# Patient Record
Sex: Female | Born: 1970 | Race: Black or African American | Hispanic: No | Smoking: Current every day smoker
Health system: Southern US, Community
[De-identification: ages and names within clinical notes are randomized; demographics above are authoritative.]

## PROBLEM LIST (undated history)

## (undated) DIAGNOSIS — I10 Essential (primary) hypertension: Secondary | ICD-10-CM

## (undated) DIAGNOSIS — F329 Major depressive disorder, single episode, unspecified: Secondary | ICD-10-CM

## (undated) DIAGNOSIS — A599 Trichomoniasis, unspecified: Secondary | ICD-10-CM

## (undated) DIAGNOSIS — R7303 Prediabetes: Secondary | ICD-10-CM

## (undated) DIAGNOSIS — J45909 Unspecified asthma, uncomplicated: Secondary | ICD-10-CM

## (undated) DIAGNOSIS — F32A Depression, unspecified: Secondary | ICD-10-CM

## (undated) DIAGNOSIS — L732 Hidradenitis suppurativa: Secondary | ICD-10-CM

## (undated) DIAGNOSIS — F419 Anxiety disorder, unspecified: Secondary | ICD-10-CM

## (undated) DIAGNOSIS — D649 Anemia, unspecified: Secondary | ICD-10-CM

## (undated) DIAGNOSIS — E7800 Pure hypercholesterolemia, unspecified: Secondary | ICD-10-CM

## (undated) DIAGNOSIS — F319 Bipolar disorder, unspecified: Secondary | ICD-10-CM

## (undated) HISTORY — DX: Prediabetes: R73.03

## (undated) HISTORY — PX: ABDOMINAL HYSTERECTOMY: SHX81

## (undated) HISTORY — PX: MOUTH SURGERY: SHX715

## (undated) HISTORY — DX: Pure hypercholesterolemia, unspecified: E78.00

## (undated) HISTORY — DX: Hidradenitis suppurativa: L73.2

---

## 1997-03-05 ENCOUNTER — Inpatient Hospital Stay (HOSPITAL_COMMUNITY): Admission: AD | Admit: 1997-03-05 | Discharge: 1997-03-08 | Payer: Self-pay | Admitting: Obstetrics

## 1997-03-17 ENCOUNTER — Inpatient Hospital Stay (HOSPITAL_COMMUNITY): Admission: AD | Admit: 1997-03-17 | Discharge: 1997-03-18 | Payer: Self-pay | Admitting: Obstetrics

## 1997-05-30 ENCOUNTER — Other Ambulatory Visit: Admission: RE | Admit: 1997-05-30 | Discharge: 1997-05-30 | Payer: Self-pay | Admitting: Obstetrics

## 1998-02-07 ENCOUNTER — Inpatient Hospital Stay (HOSPITAL_COMMUNITY): Admission: AD | Admit: 1998-02-07 | Discharge: 1998-02-07 | Payer: Self-pay | Admitting: Obstetrics

## 1998-02-08 ENCOUNTER — Emergency Department (HOSPITAL_COMMUNITY): Admission: EM | Admit: 1998-02-08 | Discharge: 1998-02-08 | Payer: Self-pay | Admitting: Emergency Medicine

## 1998-07-09 ENCOUNTER — Emergency Department (HOSPITAL_COMMUNITY): Admission: EM | Admit: 1998-07-09 | Discharge: 1998-07-10 | Payer: Self-pay | Admitting: Emergency Medicine

## 1999-01-12 ENCOUNTER — Emergency Department (HOSPITAL_COMMUNITY): Admission: EM | Admit: 1999-01-12 | Discharge: 1999-01-12 | Payer: Self-pay | Admitting: Emergency Medicine

## 1999-02-10 ENCOUNTER — Inpatient Hospital Stay (HOSPITAL_COMMUNITY): Admission: AD | Admit: 1999-02-10 | Discharge: 1999-02-10 | Payer: Self-pay | Admitting: Obstetrics

## 2000-08-16 ENCOUNTER — Emergency Department (HOSPITAL_COMMUNITY): Admission: EM | Admit: 2000-08-16 | Discharge: 2000-08-16 | Payer: Self-pay

## 2000-08-18 ENCOUNTER — Emergency Department (HOSPITAL_COMMUNITY): Admission: EM | Admit: 2000-08-18 | Discharge: 2000-08-18 | Payer: Self-pay | Admitting: *Deleted

## 2001-07-06 ENCOUNTER — Inpatient Hospital Stay (HOSPITAL_COMMUNITY): Admission: AD | Admit: 2001-07-06 | Discharge: 2001-07-06 | Payer: Self-pay | Admitting: Obstetrics & Gynecology

## 2001-07-28 ENCOUNTER — Encounter (INDEPENDENT_AMBULATORY_CARE_PROVIDER_SITE_OTHER): Payer: Self-pay

## 2001-07-28 ENCOUNTER — Observation Stay (HOSPITAL_COMMUNITY): Admission: RE | Admit: 2001-07-28 | Discharge: 2001-07-29 | Payer: Self-pay | Admitting: Obstetrics & Gynecology

## 2002-04-26 ENCOUNTER — Other Ambulatory Visit: Admission: RE | Admit: 2002-04-26 | Discharge: 2002-04-26 | Payer: Self-pay | Admitting: *Deleted

## 2003-01-05 ENCOUNTER — Emergency Department (HOSPITAL_COMMUNITY): Admission: EM | Admit: 2003-01-05 | Discharge: 2003-01-05 | Payer: Self-pay | Admitting: Emergency Medicine

## 2003-01-25 ENCOUNTER — Emergency Department (HOSPITAL_COMMUNITY): Admission: EM | Admit: 2003-01-25 | Discharge: 2003-01-25 | Payer: Self-pay | Admitting: Emergency Medicine

## 2003-02-15 ENCOUNTER — Emergency Department (HOSPITAL_COMMUNITY): Admission: EM | Admit: 2003-02-15 | Discharge: 2003-02-15 | Payer: Self-pay | Admitting: Emergency Medicine

## 2003-03-28 ENCOUNTER — Other Ambulatory Visit: Admission: RE | Admit: 2003-03-28 | Discharge: 2003-03-28 | Payer: Self-pay | Admitting: Obstetrics and Gynecology

## 2004-07-04 ENCOUNTER — Other Ambulatory Visit: Admission: RE | Admit: 2004-07-04 | Discharge: 2004-07-04 | Payer: Self-pay | Admitting: Obstetrics and Gynecology

## 2005-02-10 ENCOUNTER — Emergency Department (HOSPITAL_COMMUNITY): Admission: EM | Admit: 2005-02-10 | Discharge: 2005-02-10 | Payer: Self-pay | Admitting: Emergency Medicine

## 2005-07-06 ENCOUNTER — Other Ambulatory Visit: Admission: RE | Admit: 2005-07-06 | Discharge: 2005-07-06 | Payer: Self-pay | Admitting: Obstetrics and Gynecology

## 2005-10-28 ENCOUNTER — Emergency Department (HOSPITAL_COMMUNITY): Admission: EM | Admit: 2005-10-28 | Discharge: 2005-10-28 | Payer: Self-pay | Admitting: Family Medicine

## 2006-01-18 ENCOUNTER — Emergency Department (HOSPITAL_COMMUNITY): Admission: EM | Admit: 2006-01-18 | Discharge: 2006-01-18 | Payer: Self-pay | Admitting: Emergency Medicine

## 2007-01-04 ENCOUNTER — Emergency Department (HOSPITAL_COMMUNITY): Admission: EM | Admit: 2007-01-04 | Discharge: 2007-01-05 | Payer: Self-pay | Admitting: Emergency Medicine

## 2007-01-07 ENCOUNTER — Emergency Department (HOSPITAL_COMMUNITY): Admission: EM | Admit: 2007-01-07 | Discharge: 2007-01-07 | Payer: Self-pay | Admitting: Sports Medicine

## 2008-04-10 ENCOUNTER — Emergency Department (HOSPITAL_COMMUNITY): Admission: EM | Admit: 2008-04-10 | Discharge: 2008-04-10 | Payer: Self-pay | Admitting: Emergency Medicine

## 2008-10-22 DIAGNOSIS — A6000 Herpesviral infection of urogenital system, unspecified: Secondary | ICD-10-CM | POA: Insufficient documentation

## 2008-10-22 DIAGNOSIS — D649 Anemia, unspecified: Secondary | ICD-10-CM

## 2008-10-22 DIAGNOSIS — J45909 Unspecified asthma, uncomplicated: Secondary | ICD-10-CM | POA: Insufficient documentation

## 2008-10-23 ENCOUNTER — Ambulatory Visit: Payer: Self-pay | Admitting: Internal Medicine

## 2008-10-23 DIAGNOSIS — R51 Headache: Secondary | ICD-10-CM

## 2008-10-23 DIAGNOSIS — E789 Disorder of lipoprotein metabolism, unspecified: Secondary | ICD-10-CM | POA: Insufficient documentation

## 2008-10-23 DIAGNOSIS — R519 Headache, unspecified: Secondary | ICD-10-CM | POA: Insufficient documentation

## 2008-10-23 DIAGNOSIS — G43909 Migraine, unspecified, not intractable, without status migrainosus: Secondary | ICD-10-CM | POA: Insufficient documentation

## 2008-10-23 DIAGNOSIS — F17200 Nicotine dependence, unspecified, uncomplicated: Secondary | ICD-10-CM

## 2008-10-23 DIAGNOSIS — I10 Essential (primary) hypertension: Secondary | ICD-10-CM | POA: Insufficient documentation

## 2008-10-23 LAB — CONVERTED CEMR LAB
ALT: 14 units/L (ref 0–35)
AST: 19 units/L (ref 0–37)
Albumin: 3.8 g/dL (ref 3.5–5.2)
Alkaline Phosphatase: 50 units/L (ref 39–117)
BUN: 6 mg/dL (ref 6–23)
Basophils Absolute: 0.1 10*3/uL (ref 0.0–0.1)
Basophils Relative: 1.4 % (ref 0.0–3.0)
Bilirubin, Direct: 0.2 mg/dL (ref 0.0–0.3)
CO2: 29 meq/L (ref 19–32)
Calcium: 8.9 mg/dL (ref 8.4–10.5)
Chloride: 105 meq/L (ref 96–112)
Cholesterol: 187 mg/dL (ref 0–200)
Creatinine, Ser: 0.9 mg/dL (ref 0.4–1.2)
Eosinophils Absolute: 0.2 10*3/uL (ref 0.0–0.7)
Eosinophils Relative: 2.1 % (ref 0.0–5.0)
GFR calc non Af Amer: 90.86 mL/min (ref >60)
Glucose, Bld: 111 mg/dL — ABNORMAL HIGH (ref 70–99)
HCT: 40.9 % (ref 36.0–46.0)
HDL: 43.6 mg/dL (ref >39.00)
Hemoglobin: 14.2 g/dL (ref 12.0–15.0)
LDL Cholesterol: 104 mg/dL — ABNORMAL HIGH (ref 0–99)
Lymphocytes Relative: 27.9 % (ref 12.0–46.0)
Lymphs Abs: 2.7 10*3/uL (ref 0.7–4.0)
MCHC: 34.6 g/dL (ref 30.0–36.0)
MCV: 89.2 fL (ref 78.0–100.0)
Monocytes Absolute: 0.4 10*3/uL (ref 0.1–1.0)
Monocytes Relative: 4.4 % (ref 3.0–12.0)
Neutro Abs: 6.1 10*3/uL (ref 1.4–7.7)
Neutrophils Relative %: 64.2 % (ref 43.0–77.0)
Pap Smear: NORMAL
Platelets: 210 10*3/uL (ref 150.0–400.0)
Potassium: 4.2 meq/L (ref 3.5–5.1)
RBC: 4.59 M/uL (ref 3.87–5.11)
RDW: 13.6 % (ref 11.5–14.6)
Sodium: 139 meq/L (ref 135–145)
TSH: 0.87 microintl units/mL (ref 0.35–5.50)
Total Bilirubin: 0.9 mg/dL (ref 0.3–1.2)
Total CHOL/HDL Ratio: 4
Total Protein: 7 g/dL (ref 6.0–8.3)
Triglycerides: 197 mg/dL — ABNORMAL HIGH (ref 0.0–149.0)
VLDL: 39.4 mg/dL (ref 0.0–40.0)
WBC: 9.5 10*3/uL (ref 4.5–10.5)

## 2008-11-05 ENCOUNTER — Encounter: Payer: Self-pay | Admitting: Internal Medicine

## 2008-11-22 ENCOUNTER — Ambulatory Visit: Payer: Self-pay | Admitting: Internal Medicine

## 2008-12-03 ENCOUNTER — Telehealth: Payer: Self-pay | Admitting: Internal Medicine

## 2009-05-21 ENCOUNTER — Ambulatory Visit: Payer: Self-pay | Admitting: Internal Medicine

## 2009-11-15 ENCOUNTER — Emergency Department (HOSPITAL_COMMUNITY): Admission: EM | Admit: 2009-11-15 | Discharge: 2009-11-15 | Payer: Self-pay | Admitting: Emergency Medicine

## 2009-12-16 ENCOUNTER — Other Ambulatory Visit: Admission: RE | Admit: 2009-12-16 | Discharge: 2009-12-16 | Payer: Self-pay | Admitting: Family Medicine

## 2010-04-17 LAB — CBC
HCT: 41.3 % (ref 36.0–46.0)
Hemoglobin: 14.8 g/dL (ref 12.0–15.0)
MCH: 30.4 pg (ref 26.0–34.0)
MCHC: 35.8 g/dL (ref 30.0–36.0)
MCV: 84.8 fL (ref 78.0–100.0)
Platelets: 230 10*3/uL (ref 150–400)
RBC: 4.87 MIL/uL (ref 3.87–5.11)
RDW: 14.1 % (ref 11.5–15.5)
WBC: 10.6 10*3/uL — ABNORMAL HIGH (ref 4.0–10.5)

## 2010-04-17 LAB — DIFFERENTIAL
Basophils Absolute: 0.1 10*3/uL (ref 0.0–0.1)
Basophils Relative: 1 % (ref 0–1)
Eosinophils Absolute: 0.2 10*3/uL (ref 0.0–0.7)
Eosinophils Relative: 2 % (ref 0–5)
Lymphocytes Relative: 36 % (ref 12–46)
Lymphs Abs: 3.8 10*3/uL (ref 0.7–4.0)
Monocytes Absolute: 0.5 10*3/uL (ref 0.1–1.0)
Monocytes Relative: 5 % (ref 3–12)
Neutro Abs: 6.1 10*3/uL (ref 1.7–7.7)
Neutrophils Relative %: 58 % (ref 43–77)

## 2010-04-17 LAB — URINALYSIS, ROUTINE W REFLEX MICROSCOPIC
Bilirubin Urine: NEGATIVE
Glucose, UA: NEGATIVE mg/dL
Hgb urine dipstick: NEGATIVE
Ketones, ur: NEGATIVE mg/dL
Nitrite: NEGATIVE
Protein, ur: NEGATIVE mg/dL
Specific Gravity, Urine: 1.011 (ref 1.005–1.030)
Urobilinogen, UA: 0.2 mg/dL (ref 0.0–1.0)
pH: 6.5 (ref 5.0–8.0)

## 2010-04-17 LAB — COMPREHENSIVE METABOLIC PANEL
ALT: 13 U/L (ref 0–35)
AST: 16 U/L (ref 0–37)
Albumin: 4 g/dL (ref 3.5–5.2)
Alkaline Phosphatase: 51 U/L (ref 39–117)
BUN: 5 mg/dL — ABNORMAL LOW (ref 6–23)
CO2: 26 mEq/L (ref 19–32)
Calcium: 9.3 mg/dL (ref 8.4–10.5)
Chloride: 108 mEq/L (ref 96–112)
Creatinine, Ser: 0.76 mg/dL (ref 0.4–1.2)
GFR calc Af Amer: >60 mL/min (ref >60)
GFR calc non Af Amer: >60 mL/min (ref >60)
Glucose, Bld: 92 mg/dL (ref 70–99)
Potassium: 4 mEq/L (ref 3.5–5.1)
Sodium: 139 mEq/L (ref 135–145)
Total Bilirubin: 0.4 mg/dL (ref 0.3–1.2)
Total Protein: 7.2 g/dL (ref 6.0–8.3)

## 2010-04-17 LAB — POCT PREGNANCY, URINE: Preg Test, Ur: NEGATIVE

## 2010-04-17 LAB — URINE MICROSCOPIC-ADD ON

## 2010-06-20 NOTE — Op Note (Signed)
[facility]  Patient:    Yolanda Richard, Yolanda Richard Visit Number: [id number] MRN: [id number]          Service Type: DSU Location: 9300 [id number] Attending Physician:  Minette Headland Dictated by:   Freddy Finner, M.D. Proc. Date: 07/28/01 Admit Date:  07/28/2001                             Operative Report  PREOPERATIVE DIAGNOSES:       1. Dysmenorrhea.                               2. Dysfunctional uterine bleeding, unresponsive                                  to oral contraceptives.  POSTOPERATIVE DIAGNOSES:      1. Dysmenorrhea.                               2. Dysfunctional uterine bleeding, unresponsive                                  to oral contraceptives.  OPERATION:                    Total vaginal hysterectomy.  SURGEON:                      Freddy Finner, M.D.  ASSISTANT:                    Guy Sandifer. Arleta Creek, M.D.  ESTIMATED BLOOD LOSS:         Less than 100 cc.  INTRAOPERATIVE COMPLICATIONS:                None.  INDICATIONS:                  The patient is a 40 year old, her present illness is recorded in the admission note.  DESCRIPTION OF PROCEDURE:     She was admitted on the day of surgery.  She was given a bolus of IV Ceftin.  She was placed in PAS hose, she was brought to the operating room and there placed under adequate general endotracheal anesthesia, placed in the dorsal lithotomy position using the [place] stirrups system.  A Betadine prep was carried out with a scrub, followed by solution and sterile drapes were applied.  A posterior weighted vaginal retractor was placed, Deavers were used to retract the lateral and anterior vaginal walls. The cervix was grasped with a Christella Hartigan tenaculum.  The cul-de-sac was tented with an Allis and an incision made with Mayos.  The cervix was circumscribed with a scalpel.  Using the Gyro tissue desiccation system, the following ligaments were developed and divided; the  uterosacrales, the bladder pillars, the cardinal ligament pedicles after advancing the bladder further off the cervix.  The anterior peritoneum was entered, vessel pedicles were taken with the Gyro system, sealed and divided.  The uterus was delivered through the vaginal introitus.  The utero-ovarian pedicles were again sealed with the Gyro system using two bites on the left side and a single bite on the right.  The uterus was then completely removed.  Careful inspection of all the pedicles revealed complete hemostasis.  The ovaries were carefully visualized and were normal with recent evidence of ovulation on the left side.  The angles of the vagina were then anchored to the uterosacrales with a mattress suture of 0 Monocryl.  The cuff was closed vertically with figure-of-eights of 0 Monocryl. A Foley catheter was placed.  The patient was awakened and taken to recovery in good condition. Dictated by:   Freddy Finner, M.D. Attending Physician:  Minette Headland DD:  07/28/01 TD:  07/28/01 Job: [id number]

## 2010-06-20 NOTE — H&P (Signed)
[facility]  Patient:    Yolanda Richard, Yolanda Richard Visit Number: [id number] MRN: [id number]          Service Type: DSU Location: 9300 [id number] Attending Physician:  Minette Headland Dictated by:   Freddy Finner, M.D. Admit Date:  07/28/2001                           History and Physical  ADMITTING DIAGNOSES:          1. Dysfunction uterine bleeding.                               2. Severe dysmenorrhea.                               3. Menorrhagia.                               4. Unresponsive to oral contraceptives.                               5. Multiparity.                               6. Previous surgical sterilization.                               7. Request for definitive surgical                                  intervention.  HISTORY OF PRESENT ILLNESS:   The patient is a 40 year old married black female, gravida 4 para 4, who had a tubal sterilization in 1999.  She has a history of having used Depo-Provera off and on for control of conception and for dysfunction bleeding.  She presented in April 2003 complaining of essentially constant bleeding since January 2003.  She was tried on oral contraceptives b.i.d. initially to try and control bleeding without success. Her pelvic ultrasound showed no significant abnormalities although the uterus itself may be slightly increased in size.  She has requested definitive surgical treatment and is admitted now for vaginal hysterectomy.  CURRENT REVIEW OF SYSTEMS:    Otherwise negative.  No cardiopulmonary, GI, or GU complaints.  PHYSICAL EXAMINATION:  HEENT:                        Grossly within normal limits.  NECK:                         Thyroid gland is not palpably enlarged.  VITAL SIGNS:                  Blood pressure in the office was 110/76.  CHEST:                        Clear to auscultation.  HEART:                        Normal  sinus rhythm without murmurs, rubs,  or gallops.  BREAST:                       Examination considered to be normal.  There are no palpable masses, no skin change, no nipple discharge.  ABDOMEN:                      Soft and nontender, without appreciable organomegaly or palpable masses.  PELVIC:                       External genitalia, vagina, and cervix are normal to inspection.  The uterus is anterior position on bimanual and not significantly enlarged.  There is  no palpable adnexal mass.  RECTAL:                       The rectum is normal.  Rectovaginal examination confirms.  ASSESSMENT:                   Dysfunction uterine bleeding with menometrorrhagia and severe dysmenorrhea unresponsive to conservative hormonal therapy, with essentially normal ultrasound findings.  The patient has definitively requested hysterectomy and is admitted at this time for that purpose.  She has reviewed a video in the office describing the operative procedure and the potential complications associated with that procedure, and is prepared to proceed.  PLAN:                         Total vaginal hysterectomy in the morning.Dictated by:   Freddy Finner, M.D. Attending Physician:  Minette Headland DD:  07/27/01 TD:  07/27/01 Job: [id number]

## 2010-08-05 ENCOUNTER — Inpatient Hospital Stay (HOSPITAL_COMMUNITY)
Admission: AD | Admit: 2010-08-05 | Discharge: 2010-08-05 | Disposition: A | Discharge status: Home / Self Care | Payer: Managed Care, Other (non HMO) | Source: Ambulatory Visit | Attending: Obstetrics and Gynecology | Admitting: Obstetrics and Gynecology

## 2010-08-05 DIAGNOSIS — L02219 Cutaneous abscess of trunk, unspecified: Secondary | ICD-10-CM | POA: Insufficient documentation

## 2010-08-08 LAB — WOUND CULTURE

## 2011-05-08 ENCOUNTER — Other Ambulatory Visit: Payer: Self-pay | Admitting: Obstetrics & Gynecology

## 2011-05-08 DIAGNOSIS — R928 Other abnormal and inconclusive findings on diagnostic imaging of breast: Secondary | ICD-10-CM

## 2011-05-12 ENCOUNTER — Ambulatory Visit
Admission: RE | Admit: 2011-05-12 | Discharge: 2011-05-12 | Disposition: A | Discharge status: Home / Self Care | Payer: Managed Care, Other (non HMO) | Source: Ambulatory Visit | Attending: Obstetrics & Gynecology | Admitting: Obstetrics & Gynecology

## 2011-05-12 DIAGNOSIS — R928 Other abnormal and inconclusive findings on diagnostic imaging of breast: Secondary | ICD-10-CM

## 2011-08-26 ENCOUNTER — Inpatient Hospital Stay (HOSPITAL_COMMUNITY)
Admission: AD | Admit: 2011-08-26 | Discharge: 2011-08-26 | Disposition: A | Discharge status: Home / Self Care | Payer: Medicaid Other | Source: Ambulatory Visit | Attending: Obstetrics & Gynecology | Admitting: Obstetrics & Gynecology

## 2011-08-26 ENCOUNTER — Encounter (HOSPITAL_COMMUNITY): Payer: Self-pay

## 2011-08-26 DIAGNOSIS — N949 Unspecified condition associated with female genital organs and menstrual cycle: Secondary | ICD-10-CM | POA: Insufficient documentation

## 2011-08-26 DIAGNOSIS — B373 Candidiasis of vulva and vagina: Secondary | ICD-10-CM

## 2011-08-26 DIAGNOSIS — B3731 Acute candidiasis of vulva and vagina: Secondary | ICD-10-CM | POA: Insufficient documentation

## 2011-08-26 HISTORY — DX: Essential (primary) hypertension: I10

## 2011-08-26 LAB — WET PREP, GENITAL
Clue Cells Wet Prep HPF POC: NONE SEEN
Trich, Wet Prep: NONE SEEN
WBC, Wet Prep HPF POC: NONE SEEN
Yeast Wet Prep HPF POC: NONE SEEN

## 2011-08-26 MED ORDER — FLUCONAZOLE 150 MG PO TABS: 150.0000 mg | ORAL_TABLET | Freq: Once | ORAL | Status: AC

## 2011-08-26 NOTE — MAU Note (Signed)
Pt states she had intercourse 08/22/2011, is unsure if condom is still inside her. Since 08/24/2011 has noted a white odorous vaginal discharge. Has itching internally, took metronidazole application, no help. Denies uti s/s.

## 2011-08-26 NOTE — MAU Provider Note (Signed)
  History     CSN: [id number]  Arrival date and time: 08/26/11 1528   First Provider Initiated Contact with Patient 08/26/11 1627      Chief Complaint  Patient presents with  . Vaginal Discharge   HPI  Pt is not pregnant and presents with condom failure with new partner on 08/22/2011.  Pt cannot find condom.  She complains of vaginal discharge with odor and vaginal irritation when she urinates. She denies pain with urination.   Past Medical History  Diagnosis Date  . Preterm labor   . Hypertension     Past Surgical History  Procedure Date  . Abdominal hysterectomy   . Mouth surgery     Family History  Problem Relation Age of Onset  . Other Neg Hx     History  Substance Use Topics  . Smoking status: Current Everyday Smoker -- 0.7 packs/day    Types: Cigarettes  . Smokeless tobacco: Never Used  . Alcohol Use: Yes     occasional    Allergies: No Known Allergies  Prescriptions prior to admission  Medication Sig Dispense Refill  . amLODipine-valsartan (EXFORGE) 5-160 MG per tablet Take 1 tablet by mouth daily. Bloodpressure        Review of Systems  Constitutional: Negative for fever and chills.  Gastrointestinal: Negative for nausea, vomiting, abdominal pain, diarrhea and constipation.  Genitourinary: Negative for dysuria and urgency.  Neurological: Negative for headaches.   Physical Exam   Blood pressure 143/73, pulse 57, temperature 98.9 F (37.2 C), temperature source Oral, resp. rate 16, height 5' 3.5" (1.613 m), weight 163 lb (73.936 kg), SpO2 98.00%.  Physical Exam  Nursing note and vitals reviewed. Constitutional: She is oriented to person, place, and time. She appears well-developed.  HENT:  Head: Normocephalic.  Eyes: Pupils are equal, round, and reactive to light.  Neck: Normal range of motion. Thyromegaly present.  Cardiovascular: Normal rate.   Respiratory: Effort normal.  GI: Soft. She exhibits no distension. There is no tenderness. There is  no rebound and no guarding.  Genitourinary:       Mod amount of cottage cheese type discharge in vault; condom retrieved with ring forceps. Bimanual nontender s/p hyst  Musculoskeletal: Normal range of motion.  Neurological: She is alert and oriented to person, place, and time.  Skin: Skin is warm and dry.  Psychiatric: She has a normal mood and affect.    MAU Course  Procedures Condom retrieval  Wet prep-normal GC/chlamydia- pending Will treat yeast clinically Assessment and Plan  Retained condom Yeast vaginitis- Diflucan 150mg  #2 one now repeat 72 hours for yeast Roy Tokarz 08/26/2011, 4:28 PM

## 2011-08-26 NOTE — MAU Note (Signed)
Patient states she last had sex on 7-20 and thinks she may have a condom still in the vagina. Has been having a vaginal discharge with an odor and vaginal burning since Monday 7-22.

## 2011-08-27 LAB — GC/CHLAMYDIA PROBE AMP, GENITAL
Chlamydia, DNA Probe: NEGATIVE
GC Probe Amp, Genital: NEGATIVE

## 2011-08-27 NOTE — MAU Provider Note (Signed)
Attestation of Attending Supervision of Advanced Practitioner (CNM/NP): Evaluation and management procedures were performed by the Advanced Practitioner under my supervision and collaboration.  I have reviewed the Advanced Practitioner's note and chart, and I agree with the management and plan.  Jaynie Collins, M.D. 08/27/2011 9:19 AM

## 2011-11-02 ENCOUNTER — Encounter (HOSPITAL_COMMUNITY): Payer: Self-pay | Admitting: *Deleted

## 2011-11-02 ENCOUNTER — Inpatient Hospital Stay (HOSPITAL_COMMUNITY): Payer: Medicaid Other

## 2011-11-02 ENCOUNTER — Inpatient Hospital Stay (HOSPITAL_COMMUNITY)
Admission: AD | Admit: 2011-11-02 | Discharge: 2011-11-02 | Disposition: A | Discharge status: Home / Self Care | Payer: Medicaid Other | Source: Ambulatory Visit | Attending: Obstetrics & Gynecology | Admitting: Obstetrics & Gynecology

## 2011-11-02 DIAGNOSIS — N83209 Unspecified ovarian cyst, unspecified side: Secondary | ICD-10-CM | POA: Insufficient documentation

## 2011-11-02 DIAGNOSIS — N83299 Other ovarian cyst, unspecified side: Secondary | ICD-10-CM

## 2011-11-02 DIAGNOSIS — R1032 Left lower quadrant pain: Secondary | ICD-10-CM | POA: Insufficient documentation

## 2011-11-02 HISTORY — DX: Trichomoniasis, unspecified: A59.9

## 2011-11-02 LAB — URINALYSIS, ROUTINE W REFLEX MICROSCOPIC
Bilirubin Urine: NEGATIVE
Glucose, UA: NEGATIVE mg/dL
Hgb urine dipstick: NEGATIVE
Ketones, ur: NEGATIVE mg/dL
Nitrite: NEGATIVE
Protein, ur: NEGATIVE mg/dL
Specific Gravity, Urine: 1.015 (ref 1.005–1.030)
Urobilinogen, UA: 0.2 mg/dL (ref 0.0–1.0)
pH: >9 — ABNORMAL HIGH (ref 5.0–8.0)

## 2011-11-02 LAB — WET PREP, GENITAL: Yeast Wet Prep HPF POC: NONE SEEN

## 2011-11-02 MED ORDER — METRONIDAZOLE 500 MG PO TABS
2000.0000 mg | ORAL_TABLET | Freq: Once | ORAL | Status: AC
  Administered 2011-11-02: 2000 mg via ORAL
  Filled 2011-11-02: qty 4

## 2011-11-02 NOTE — MAU Note (Signed)
Patient states she started having lower abdominal pain on 9-27, stopped the started again on 9-28 and has not stopped since. Pain in abdomen when trying to urinate and have a BM.

## 2011-11-02 NOTE — MAU Provider Note (Signed)
Attestation of Attending Supervision of Advanced Practitioner (CNM/NP): Evaluation and management procedures were performed by the Advanced Practitioner under my supervision and collaboration.  I have reviewed the Advanced Practitioner's note and chart, and I agree with the management and plan.  Kanoelani Dobies, MD, FACOG Attending Obstetrician & Gynecologist Faculty Practice, [facility]  

## 2011-11-02 NOTE — MAU Note (Addendum)
States Dr. Jennette Kettle did her hysterectomy, 2003, but she is no longer a patient at that office. States besides abdominal pain and pain with urination, she has a problem with recurring "boils" and has some on her perineum today.

## 2011-11-02 NOTE — MAU Provider Note (Signed)
  History     CSN: [id number]  Arrival date and time: 11/02/11 1207   First Provider Initiated Contact with Patient 11/02/11 1247      Chief Complaint  Patient presents with  . Abdominal Pain   HPI  Yolanda Richard is a 41 y.o. [id number] who has had a hysterectomy, but still has her ovaries. She has had LLQ pain since early this morning. She states that it is intermittent and colicky in nature. She finds that resting will help the pain. She rates it as a 10/10. She denies any n/v/d/c, fever or chills. She denies any urinary frequency or urgency  Past Medical History  Diagnosis Date  . Preterm labor   . Hypertension   . Abscess     Multiple times on perineal area.  . Trichomonas     Past Surgical History  Procedure Date  . Abdominal hysterectomy   . Mouth surgery     Family History  Problem Relation Age of Onset  . Other Neg Hx     History  Substance Use Topics  . Smoking status: Current Every Day Smoker -- 0.7 packs/day    Types: Cigarettes  . Smokeless tobacco: Never Used  . Alcohol Use: Yes     occasional    Allergies: No Known Allergies  Prescriptions prior to admission  Medication Sig Dispense Refill  . amLODipine-valsartan (EXFORGE) 5-160 MG per tablet Take 1 tablet by mouth daily. Bloodpressure        Review of Systems  Constitutional: Negative for fever and chills.  Gastrointestinal: Positive for abdominal pain (see HPI). Negative for nausea, vomiting, diarrhea and constipation.  Genitourinary: Negative for dysuria, urgency and frequency.   Physical Exam   Blood pressure 144/89, pulse 69, temperature 98.4 F (36.9 C), temperature source Oral, resp. rate 16, height 5' 4.5" (1.638 m), weight 74.208 kg (163 lb 9.6 oz), SpO2 100.00%.  Physical Exam  Nursing note and vitals reviewed. Constitutional: She is oriented to person, place, and time. She appears well-developed and well-nourished.  Cardiovascular: Normal rate and regular rhythm.     Respiratory: Effort normal and breath sounds normal.  GI: Soft. Bowel sounds are normal.  Genitourinary:       External: normal Vagina: copious milky white discharge Cervix: surgically absent Uterus: surgically absent Left adnexa: tender Right adnexa: non-tender  Neurological: She is alert and oriented to person, place, and time.  Skin: Skin is warm and dry.    MAU Course  Procedures    Assessment and Plan   1. Other and unspecified ovarian cyst   2. Ovarian cyst, complex    Will schedule appointment for FU in GYN clinic.   Tawnya Crook 11/02/2011, 1:03 PM

## 2011-11-03 LAB — GC/CHLAMYDIA PROBE AMP, GENITAL
Chlamydia, DNA Probe: NEGATIVE
GC Probe Amp, Genital: NEGATIVE

## 2011-11-04 ENCOUNTER — Telehealth: Payer: Self-pay | Admitting: General Practice

## 2011-11-04 DIAGNOSIS — N83209 Unspecified ovarian cyst, unspecified side: Secondary | ICD-10-CM

## 2011-11-04 NOTE — Telephone Encounter (Signed)
Called pt and was unable to leave message due to "customer does not have voicemail box set up yet".

## 2011-11-04 NOTE — Telephone Encounter (Signed)
Message copied by Mannie Stabile on Wed Nov 04, 2011  4:52 PM ------      Message from: Adam Phenix      Created: Wed Nov 04, 2011  4:41 PM       Recommend repeat US 6 weeks, cyst is likely to resolve in that time, f/u in Gyn clinic when completed

## 2011-11-04 NOTE — Telephone Encounter (Signed)
Pt called stating she was in the ER on Monday at [facility] for abdominal pain where she had an ultrasound done and they found a huge ovarian cyst, the lady told her it was the size of her hand. Patient states that cancer runs in both sides of her family, maternal and paternal and is concerned about this finding and doesn't know if she needs a biopsy or not. Patient also states that she is on flagyl right now for BV and trich and knows that the infection needs to run its course as well as taking all the medication before anything can be done. Patient states she would like a call back and wants to know what the course she's going to be on at this point and what she needs to prepare her mind for and would like to know how soon she can get an appt and would like a call back.   I am sending this to Dr. Debroah Loop for review to determine when the patient needs another U/S and follow up appt here in the clinic- no appointments are currently made for her

## 2011-11-05 ENCOUNTER — Other Ambulatory Visit: Payer: Self-pay | Admitting: Obstetrics & Gynecology

## 2011-11-05 DIAGNOSIS — N83209 Unspecified ovarian cyst, unspecified side: Secondary | ICD-10-CM

## 2011-11-05 NOTE — Telephone Encounter (Signed)
Spoke with patient and advised of Dr. Brayton Layman recommendation. Pt is agreeable with plan. Just still a little concerned about the size of cyst. I told her that he recommends that we do the ultrasound in 6 weeks to give cyst time to resolve, u/s appt made for 11/11. Pt voiced understanding and will call us if she has any problems.

## 2011-11-06 ENCOUNTER — Telehealth: Payer: Self-pay | Admitting: *Deleted

## 2011-11-06 NOTE — Telephone Encounter (Signed)
Pt left message stating that she has 1 more question for the nurse about the cyst that was found during her visit to [facility]U.  Please call back.

## 2011-11-09 NOTE — Telephone Encounter (Signed)
Attempted to contact patient. Message says "the customer you are trying to reach does not a a VM box set up yet"

## 2011-11-10 NOTE — Telephone Encounter (Signed)
Called pt and pt wanted to know the size of her cyst.  I informed her that it was 5.8cm in diameter according to the [place].  Pt then stated "I guess I have to play the waiting game".  I advised pt that its not a waiting game but the providers just need to know if your cyst will decrease in size and they will be able to determine what form of management that would be best for her.  Pt then asked what type of cyst she has I informed her it is a ovarian cyst.  Pt stated "well there are six types of cysts, which one do I have".  I informed her that there are different types of cysts but she has an ovarian cyst at that point the pt stated that she would just call back to schedule her appt and hung up.

## 2011-12-07 ENCOUNTER — Encounter (HOSPITAL_COMMUNITY): Payer: Self-pay | Admitting: *Deleted

## 2011-12-07 ENCOUNTER — Inpatient Hospital Stay (HOSPITAL_COMMUNITY): Payer: Medicaid Other

## 2011-12-07 ENCOUNTER — Inpatient Hospital Stay (HOSPITAL_COMMUNITY)
Admission: AD | Admit: 2011-12-07 | Discharge: 2011-12-08 | Disposition: A | Discharge status: Hospice | Payer: Medicaid Other | Source: Ambulatory Visit | Attending: Obstetrics & Gynecology | Admitting: Obstetrics & Gynecology

## 2011-12-07 DIAGNOSIS — I10 Essential (primary) hypertension: Secondary | ICD-10-CM | POA: Insufficient documentation

## 2011-12-07 DIAGNOSIS — N83209 Unspecified ovarian cyst, unspecified side: Secondary | ICD-10-CM | POA: Insufficient documentation

## 2011-12-07 DIAGNOSIS — R109 Unspecified abdominal pain: Secondary | ICD-10-CM | POA: Insufficient documentation

## 2011-12-07 LAB — URINALYSIS, ROUTINE W REFLEX MICROSCOPIC
Bilirubin Urine: NEGATIVE
Glucose, UA: NEGATIVE mg/dL
Hgb urine dipstick: NEGATIVE
Specific Gravity, Urine: 1.015 (ref 1.005–1.030)
pH: 7 (ref 5.0–8.0)

## 2011-12-07 LAB — CBC WITH DIFFERENTIAL/PLATELET
Basophils Absolute: 0.1 10*3/uL (ref 0.0–0.1)
Basophils Relative: 1 % (ref 0–1)
HCT: 40.1 % (ref 36.0–46.0)
MCHC: 34.9 g/dL (ref 30.0–36.0)
Monocytes Absolute: 0.6 10*3/uL (ref 0.1–1.0)
Neutro Abs: 8.3 10*3/uL — ABNORMAL HIGH (ref 1.7–7.7)
Neutrophils Relative %: 63 % (ref 43–77)
Platelets: 212 10*3/uL (ref 150–400)
RDW: 14.4 % (ref 11.5–15.5)

## 2011-12-07 MED ORDER — TRAMADOL HCL 50 MG PO TABS: 50.0000 mg | ORAL_TABLET | Freq: Four times a day (QID) | ORAL | Status: DC | PRN

## 2011-12-07 MED ORDER — IBUPROFEN 600 MG PO TABS
600.0000 mg | ORAL_TABLET | Freq: Four times a day (QID) | ORAL | Status: DC | PRN
Start: 2011-12-07 — End: 2013-02-14

## 2011-12-07 MED ORDER — KETOROLAC TROMETHAMINE 60 MG/2ML IM SOLN
60.0000 mg | Freq: Once | INTRAMUSCULAR | Status: AC
  Administered 2011-12-07: 60 mg via INTRAMUSCULAR
  Filled 2011-12-07: qty 2

## 2011-12-07 NOTE — MAU Note (Signed)
Pt reports pain left lower abd, was seen here 6 weeks ago and dx with left ovarian cyst. Denies bleeding. Frequency.

## 2011-12-07 NOTE — MAU Provider Note (Signed)
History     CSN: [id number]  Arrival date and time: 12/07/11 2151   None     Chief Complaint  Patient presents with  . Abdominal Pain   HPI Yolanda Richard is a 41 y.o. female who presents to [facility] with abdominal pain. She was evaluated here 11/02/11 and dx with a large left ovarian cyst. She was also treated for trichomonas. She has a history of HSV. She was to follow up in 6 weeks in the GYN Clinic to have the ultrasound repeated. She returns here tonight with increased pain. She rates the pain as 8/10. The pain is located in the left lower quadrant. The pain radiates to the lower back. Took ibuprofen and it helped some. The history was provided by the patient.  OB History    Grav Para Term Preterm Abortions TAB SAB Ect Mult Living   5 4 3 1 1 1    4       Past Medical History  Diagnosis Date  . Preterm labor   . Hypertension   . Abscess     Multiple times on perineal area.  . Trichomonas     Past Surgical History  Procedure Date  . Abdominal hysterectomy   . Mouth surgery     Family History  Problem Relation Age of Onset  . Other Neg Hx   . Cancer Mother   . Cancer Maternal Aunt   . Stroke Paternal Uncle   . Diabetes Paternal Uncle   . Cancer Maternal Grandmother   . Cancer Paternal Grandmother     History  Substance Use Topics  . Smoking status: Current Every Day Smoker -- 0.7 packs/day for 16 years    Types: Cigarettes  . Smokeless tobacco: Never Used  . Alcohol Use: Yes     Comment: occasional    Allergies: No Known Allergies  No prescriptions prior to admission    Review of Systems  Constitutional: Negative for fever, chills and weight loss.  HENT: Negative for ear pain, nosebleeds, congestion, sore throat and neck pain.   Eyes: Negative for blurred vision, double vision, photophobia and pain.  Respiratory: Negative for cough, shortness of breath and wheezing.   Cardiovascular: Negative for chest pain, palpitations and leg swelling.    Gastrointestinal: Positive for nausea, abdominal pain and diarrhea. Negative for heartburn, vomiting and constipation.  Genitourinary: Positive for frequency. Negative for dysuria (pressure) and urgency.  Musculoskeletal: Positive for back pain. Negative for myalgias.  Skin: Negative for itching and rash.  Neurological: Negative for dizziness, sensory change, speech change, seizures, weakness and headaches.  Endo/Heme/Allergies: Does not bruise/bleed easily.  Psychiatric/Behavioral: Negative for depression. The patient is not nervous/anxious and does not have insomnia.    Physical Exam   Blood pressure 175/104, pulse 65, temperature 98.7 F (37.1 C), temperature source Oral, resp. rate 20, height 5\' 4"  (1.626 m), weight 168 lb (76.204 kg), SpO2 100.00%.  Physical Exam  Nursing note and vitals reviewed. Constitutional: She is oriented to person, place, and time. She appears well-developed and well-nourished. No distress.       Elevated BP, history of HTN has not been taking her medication for past 2 months.   HENT:  Head: Normocephalic and atraumatic.  Eyes: EOM are normal.  Neck: Neck supple.  Cardiovascular: Normal rate.   Respiratory: Effort normal.  GI: Soft. There is tenderness in the left lower quadrant. There is no rigidity, no rebound and no guarding.  Genitourinary:       External genitalia  without lesions. White discharge vaginal vault. Cervix absent. Left adnexal tenderness and fullness.  Musculoskeletal: Normal range of motion.  Neurological: She is alert and oriented to person, place, and time.  Skin: Skin is warm and dry.  Psychiatric: She has a normal mood and affect. Her behavior is normal. Judgment and thought content normal.   Toradol 60 mg IM  Results for orders placed during the hospital encounter of 12/07/11 (from the past 24 hour(s))  URINALYSIS, ROUTINE W REFLEX MICROSCOPIC     Status: Normal   Collection Time   12/07/11 10:00 PM      Component Value Range    Color, Urine YELLOW  YELLOW   APPearance CLEAR  CLEAR   Specific Gravity, Urine 1.015  1.005 - 1.030   pH 7.0  5.0 - 8.0   Glucose, UA NEGATIVE  NEGATIVE mg/dL   Hgb urine dipstick NEGATIVE  NEGATIVE   Bilirubin Urine NEGATIVE  NEGATIVE   Ketones, ur NEGATIVE  NEGATIVE mg/dL   Protein, ur NEGATIVE  NEGATIVE mg/dL   Urobilinogen, UA 0.2  0.0 - 1.0 mg/dL   Nitrite NEGATIVE  NEGATIVE   Leukocytes, UA NEGATIVE  NEGATIVE  CBC WITH DIFFERENTIAL     Status: Abnormal   Collection Time   12/07/11 10:10 PM      Component Value Range   WBC 13.1 (*) 4.0 - 10.5 K/uL   RBC 4.60  3.87 - 5.11 MIL/uL   Hemoglobin 14.0  12.0 - 15.0 g/dL   HCT 45.4  09.8 - 11.9 %   MCV 87.2  78.0 - 100.0 fL   MCH 30.4  26.0 - 34.0 pg   MCHC 34.9  30.0 - 36.0 g/dL   RDW 14.7  82.9 - 56.2 %   Platelets 212  150 - 400 K/uL   Neutrophils Relative 63  43 - 77 %   Neutro Abs 8.3 (*) 1.7 - 7.7 K/uL   Lymphocytes Relative 30  12 - 46 %   Lymphs Abs 4.0  0.7 - 4.0 K/uL   Monocytes Relative 5  3 - 12 %   Monocytes Absolute 0.6  0.1 - 1.0 K/uL   Eosinophils Relative 2  0 - 5 %   Eosinophils Absolute 0.2  0.0 - 0.7 K/uL   Basophils Relative 1  0 - 1 %   Basophils Absolute 0.1  0.0 - 0.1 K/uL    US Transvaginal Non-ob  12/07/2011  *RADIOLOGY REPORT*  Clinical Data: Pelvic pain; follow-up left ovarian cyst seen in September.  TRANSVAGINAL ULTRASOUND OF PELVIS  Technique:  Transvaginal ultrasound examination of the pelvis was performed including evaluation of the uterus, ovaries, adnexal regions, and pelvic cul-de-sac.  Comparison:  Pelvic ultrasound performed 11/02/2011  Findings:  Uterus:  Status post hysterectomy.  Endometrium: N/A  Right ovary: Normal appearance/no adnexal mass; measures 1.9 x 1.2 x 1.7 cm.  Difficult to fully characterize on provided images.  Left ovary: A simple 5.4 x 3.1 x 3.7 cm cyst is noted arising at the left ovary; this has decreased in size from the prior study, and is much more simple in  appearance on the current study.  The left ovary measures 5.4 x 5.0 x 3.9 cm.  No suspicious adnexal masses are seen; there is no evidence for ovarian torsion.  Other Findings:  A small amount of free fluid is noted at the right adnexa.  IMPRESSION: Simple 5.4 cm left ovarian cyst has decreased in size from the prior study, with loss of previously  noted septations.  No suspicious adnexal masses seen.  Status post hysterectomy.   Original Report Authenticated By: Tonia Ghent, M.D.     Assessment: 41 y.o. female with abdominal pain   Ovarian cyst, left which has decreased in size since previous visit   HTN  Plan:  Pain management   Follow up ultrasound in 8 weeks, return sooner for problems.   Start back on HTN medication  I have reviewed this patient's vital signs, nurses notes, appropriate labs and imaging. I have discussed finding in detail with the patient and plan of care. Patient voices understanding.   Medication List     As of 12/08/2011 12:01 AM    START taking these medications         ibuprofen 600 MG tablet   Commonly known as: ADVIL,MOTRIN   Take 1 tablet (600 mg total) by mouth every 6 (six) hours as needed for pain.      traMADol 50 MG tablet   Commonly known as: ULTRAM   Take 1 tablet (50 mg total) by mouth every 6 (six) hours as needed for pain.          Where to get your medications    These are the prescriptions that you need to pick up.   You may get these medications from any pharmacy.         ibuprofen 600 MG tablet   traMADol 50 MG tablet           MAU Course: I have discussed this patient with Dr. Penne Lash and plan of care.  Procedures  Keldan Eplin, RN, FNP, [facility] 12/07/2011, 11:59 PM

## 2011-12-08 LAB — WET PREP, GENITAL
Clue Cells Wet Prep HPF POC: NONE SEEN
Trich, Wet Prep: NONE SEEN
Yeast Wet Prep HPF POC: NONE SEEN

## 2011-12-08 LAB — GC/CHLAMYDIA PROBE AMP, GENITAL: Chlamydia, DNA Probe: NEGATIVE

## 2011-12-10 NOTE — MAU Provider Note (Signed)
Medical Screening exam and patient care preformed by advanced practice provider.  Agree with the above management.  

## 2011-12-14 ENCOUNTER — Ambulatory Visit (HOSPITAL_COMMUNITY): Payer: Medicaid Other

## 2011-12-17 ENCOUNTER — Encounter: Payer: Medicaid Other | Admitting: Obstetrics and Gynecology

## 2012-04-26 ENCOUNTER — Encounter (HOSPITAL_COMMUNITY): Payer: Self-pay | Admitting: *Deleted

## 2012-04-26 ENCOUNTER — Inpatient Hospital Stay (HOSPITAL_COMMUNITY)
Admission: AD | Admit: 2012-04-26 | Discharge: 2012-04-27 | Disposition: A | Discharge status: Home / Self Care | Payer: Medicaid Other | Source: Ambulatory Visit | Attending: Family Medicine | Admitting: Family Medicine

## 2012-04-26 DIAGNOSIS — A499 Bacterial infection, unspecified: Secondary | ICD-10-CM

## 2012-04-26 DIAGNOSIS — N6009 Solitary cyst of unspecified breast: Secondary | ICD-10-CM | POA: Insufficient documentation

## 2012-04-26 DIAGNOSIS — N760 Acute vaginitis: Secondary | ICD-10-CM | POA: Insufficient documentation

## 2012-04-26 DIAGNOSIS — N644 Mastodynia: Secondary | ICD-10-CM | POA: Insufficient documentation

## 2012-04-26 DIAGNOSIS — B9689 Other specified bacterial agents as the cause of diseases classified elsewhere: Secondary | ICD-10-CM | POA: Insufficient documentation

## 2012-04-26 DIAGNOSIS — N949 Unspecified condition associated with female genital organs and menstrual cycle: Secondary | ICD-10-CM | POA: Insufficient documentation

## 2012-04-26 LAB — WET PREP, GENITAL

## 2012-04-26 NOTE — MAU Note (Signed)
Pt reports for the last week she has had left breast tenderness. Also reports boils in her genital area

## 2012-04-26 NOTE — MAU Provider Note (Signed)
History     CSN: [id number]  Arrival date and time: 04/26/12 2147   First Provider Initiated Contact with Patient 04/26/12 2249      Chief Complaint  Patient presents with  . Breast Pain   HPI Ms. Yolanda Richard is a 42 y.o. [id number] who presents to MAU today with complain of breast pain and multiple "boils" in her vaginal area. The patient states that she has a history of benign breast cysts diagnosed 05/2011. Per radiology report follow-up in one year was recommended. Patient states that she is having bilateral breast pain that started last week. She denies feeling at masses, nipple discharge or deformity. She also states that she has had numerous boils in her vaginal area that have been coming and going. Some have drained on their own. She feels as though she may have up to 5 now. She does shave the area. Last pap was 04/2011 and she has no history of abnormal pap smears. She wants to be checked for STDs today also. Patient has had a hysterectomy.     OB History   Grav Para Term Preterm Abortions TAB SAB Ect Mult Living   5 4 3 1 1 1    4       Past Medical History  Diagnosis Date  . Preterm labor   . Hypertension   . Abscess     Multiple times on perineal area.  . Trichomonas     Past Surgical History  Procedure Laterality Date  . Abdominal hysterectomy    . Mouth surgery      Family History  Problem Relation Age of Onset  . Other Neg Hx   . Cancer Mother   . Cancer Maternal Aunt   . Stroke Paternal Uncle   . Diabetes Paternal Uncle   . Cancer Maternal Grandmother   . Cancer Paternal Grandmother     History  Substance Use Topics  . Smoking status: Current Every Day Smoker -- 0.75 packs/day for 16 years    Types: Cigarettes  . Smokeless tobacco: Never Used  . Alcohol Use: Yes     Comment: occasional    Allergies: No Known Allergies  Prescriptions prior to admission  Medication Sig Dispense Refill  . ibuprofen (ADVIL,MOTRIN) 600 MG tablet Take 1  tablet (600 mg total) by mouth every 6 (six) hours as needed for pain.  30 tablet  0  . traMADol (ULTRAM) 50 MG tablet Take 1 tablet (50 mg total) by mouth every 6 (six) hours as needed for pain.  15 tablet  0    Review of Systems  Constitutional: Negative for fever.  Gastrointestinal: Negative for abdominal pain.  Genitourinary:       + vaginal discharge Neg - vaginal bleeding  Skin:       + abscess   Physical Exam   Blood pressure 165/98, pulse 56, temperature 98.1 F (36.7 C), temperature source Oral, resp. rate 20, height 5\' 3"  (1.6 m), weight 176 lb (79.833 kg), SpO2 97.00%.  Physical Exam  Constitutional: She appears well-developed and well-nourished. No distress.  HENT:  Head: Normocephalic and atraumatic.  Cardiovascular: Normal rate, regular rhythm and normal heart sounds.   Respiratory: Effort normal and breath sounds normal. No respiratory distress.  GI: Soft. Bowel sounds are normal. She exhibits no distension and no mass. There is no tenderness. There is no rebound and no guarding.  Genitourinary: There is breast swelling (small, moveable cystic structures noted to palpation) and tenderness (mild tenderness to palpation of  the breast bilaterally around 6 o'clock). No breast discharge or bleeding. Right adnexum displays no mass and no tenderness. Left adnexum displays no mass and no tenderness. Vaginal discharge (moderate amount of thick white discharge noted in the vagina) found.  Cervix and uterus are surgically absent  Neurological: She is alert.  Skin: Skin is warm and dry. No erythema.  Psychiatric: She has a normal mood and affect.   Results for orders placed during the hospital encounter of 04/26/12 (from the past 24 hour(s))  WET PREP, GENITAL     Status: Abnormal   Collection Time    04/26/12 11:15 PM      Result Value Range   Yeast Wet Prep HPF POC NONE SEEN  NONE SEEN   Trich, Wet Prep NONE SEEN  NONE SEEN   Clue Cells Wet Prep HPF POC MODERATE (*) NONE  SEEN   WBC, Wet Prep HPF POC FEW (*) NONE SEEN    MAU Course  Procedures None  MDM Patient is due for mammogram in April. Will follow-up as scheduled.  Patient encouraged to stop shaving, change soaps, lotions, cleansers and razor for future use to avoid abscesses and BV Patient due for annual exam now  Assessment and Plan  A: Bacterial vaginosis Breast cysts  P: Discharge home Patient [facility] for annual exam, patient unsure that [facility] will see her again because of Medicaid issues. Has not been seen there recently.  Rx for flagyl sent to patient's pharmacy Patient encouraged to make appointment for mammogram next month as previously recommended Patient may return to MAU as needed or if her condition were to change or worsen  Freddi Starr, PA-C  04/26/2012, 10:51 PM

## 2012-04-27 MED ORDER — METRONIDAZOLE 500 MG PO TABS: 500.0000 mg | ORAL_TABLET | Freq: Two times a day (BID) | ORAL | Status: DC

## 2012-04-27 NOTE — MAU Provider Note (Signed)
Chart reviewed and agree with management and plan.  

## 2012-06-23 ENCOUNTER — Encounter (HOSPITAL_COMMUNITY): Payer: Self-pay | Admitting: *Deleted

## 2012-06-23 ENCOUNTER — Emergency Department (HOSPITAL_COMMUNITY)
Admission: EM | Admit: 2012-06-23 | Discharge: 2012-06-24 | Disposition: A | Discharge status: Home / Self Care | Payer: Medicaid Other | Attending: Emergency Medicine | Admitting: Emergency Medicine

## 2012-06-23 DIAGNOSIS — I10 Essential (primary) hypertension: Secondary | ICD-10-CM | POA: Insufficient documentation

## 2012-06-23 DIAGNOSIS — F17200 Nicotine dependence, unspecified, uncomplicated: Secondary | ICD-10-CM | POA: Insufficient documentation

## 2012-06-23 DIAGNOSIS — Z8619 Personal history of other infectious and parasitic diseases: Secondary | ICD-10-CM | POA: Insufficient documentation

## 2012-06-23 DIAGNOSIS — L0291 Cutaneous abscess, unspecified: Secondary | ICD-10-CM

## 2012-06-23 DIAGNOSIS — L02219 Cutaneous abscess of trunk, unspecified: Secondary | ICD-10-CM | POA: Insufficient documentation

## 2012-06-23 NOTE — ED Notes (Signed)
Pt in c/o abscess to groin area with no relief at home

## 2012-06-23 NOTE — ED Provider Notes (Signed)
History    This chart was scribed for Junious Silk, PA working with Vida Roller, MD by ED Scribe, Burman Nieves. This patient was seen in room WTR7/WTR7 and the patient's care was started at 11:01 PM.   CSN: [id number]  Arrival date & time 06/23/12  2237   First MD Initiated Contact with Patient 06/23/12 2301      Chief Complaint  Patient presents with  . Abscess    (Consider location/radiation/quality/duration/timing/severity/associated sxs/prior treatment) The history is provided by the patient. No language interpreter was used.   HPI Comments: Yolanda Richard is a 42 y.o. female who presents to the Emergency Department complaining of an abscess in her groin area that is causing sharp constant pain. Pt states that she gets them frequently and has had them lanced before in the ED. Pt states that the pain is too much to handle right now in the ED.  Pt states she has tried tea tree oil and other medications with no immediate relief. She states pain is exacerbated upon palpation. Pt denies trouble breathing/swallowing, fever, chills, cough, nausea, vomiting, diarrhea, SOB, weakness, and any other associated symptoms.   Past Medical History  Diagnosis Date  . Preterm labor   . Hypertension   . Abscess     Multiple times on perineal area.  . Trichomonas     Past Surgical History  Procedure Laterality Date  . Abdominal hysterectomy    . Mouth surgery      Family History  Problem Relation Age of Onset  . Other Neg Hx   . Cancer Mother   . Cancer Maternal Aunt   . Stroke Paternal Uncle   . Diabetes Paternal Uncle   . Cancer Maternal Grandmother   . Cancer Paternal Grandmother     History  Substance Use Topics  . Smoking status: Current Every Day Smoker -- 0.75 packs/day for 16 years    Types: Cigarettes  . Smokeless tobacco: Never Used  . Alcohol Use: Yes     Comment: occasional    OB History   Grav Para Term Preterm Abortions TAB SAB Ect Mult Living   5 4 3 1  1 1    4       Review of Systems  Skin:       abscess  All other systems reviewed and are negative.    Allergies  Review of patient's allergies indicates no known allergies.  Home Medications   Current Outpatient Rx  Name  Route  Sig  Dispense  Refill  . ibuprofen (ADVIL,MOTRIN) 600 MG tablet   Oral   Take 1 tablet (600 mg total) by mouth every 6 (six) hours as needed for pain.   30 tablet   0   . metroNIDAZOLE (FLAGYL) 500 MG tablet   Oral   Take 1 tablet (500 mg total) by mouth 2 (two) times daily.   14 tablet   0   . traMADol (ULTRAM) 50 MG tablet   Oral   Take 1 tablet (50 mg total) by mouth every 6 (six) hours as needed for pain.   15 tablet   0     BP 152/85  Pulse 75  Temp(Src) 99.2 F (37.3 C) (Oral)  Resp 15  Ht 5\' 3"  (1.6 m)  Wt 165 lb (74.844 kg)  BMI 29.24 kg/m2  SpO2 97%  Physical Exam  Nursing note and vitals reviewed. Constitutional: She is oriented to person, place, and time. She appears well-developed and well-nourished. No distress.  HENT:  Head: Normocephalic and atraumatic.  Right Ear: External ear normal.  Left Ear: External ear normal.  Nose: Nose normal.  Mouth/Throat: Oropharynx is clear and moist.  Eyes: Conjunctivae are normal.  Neck: Normal range of motion.  Cardiovascular: Normal rate, regular rhythm and normal heart sounds.   Pulmonary/Chest: Effort normal and breath sounds normal. No stridor. No respiratory distress. She has no wheezes. She has no rales.  Abdominal: Soft. She exhibits no distension.  Musculoskeletal: Normal range of motion.  Neurological: She is alert and oriented to person, place, and time. She has normal strength.  Skin: Skin is warm and dry. She is not diaphoretic. No erythema.  3 cm induration without surround erythema with central fluctuance in the left groin region.   Psychiatric: She has a normal mood and affect. Her behavior is normal.    ED Course  Procedures (including critical care  time) DIAGNOSTIC STUDIES: Oxygen Saturation is 97% on room air, adequate by my interpretation.    COORDINATION OF CARE:  11:15 PM Discussed ED treatment with pt and pt agrees.  11:44 PM Numbing and lancing procedure.  INCISION AND DRAINAGE Performed by: Junious Silk Consent: Verbal consent obtained. Risks and benefits: risks, benefits and alternatives were discussed Type: abscess  Body area: left groin  Anesthesia: local infiltration  Incision was made with a scalpel.  Local anesthetic: lidocaine 2% 1% epinephrine  Anesthetic total: 4 ml  Complexity: complex Blunt dissection to break up loculations  Drainage: purulent  Drainage amount: moderate  Patient tolerance: Patient tolerated the procedure well with no immediate complications.      Labs Reviewed - No data to display No results found.   1. Abscess       MDM  Patient with skin abscess amenable to incision and drainage.  Abscess was not large enough to warrant packing or drain,  wound recheck in 2 days. Encouraged home warm soaks and flushing.  Mild signs of cellulitis is surrounding skin.  Will d/c to home.  Bactrim given due to location of abscess.    I personally performed the services described in this documentation, which was scribed in my presence. The recorded information has been reviewed and is accurate.     Mora Bellman, PA-C 06/24/12 [phone]

## 2012-06-24 IMAGING — CR DG CHEST 2V
2 series · 2 of 2 positions shown · non-contrast
Comparison: 04/10/2008

11/15/2009 – CORRECTED EXAM ORDER:  This exam was performed in error on this patient.  Charges for this exam have been credited to the patient’s account.
CLINICAL DATA: Weakness and hypertension

 CHEST - 2 VIEW

[w chest pa]
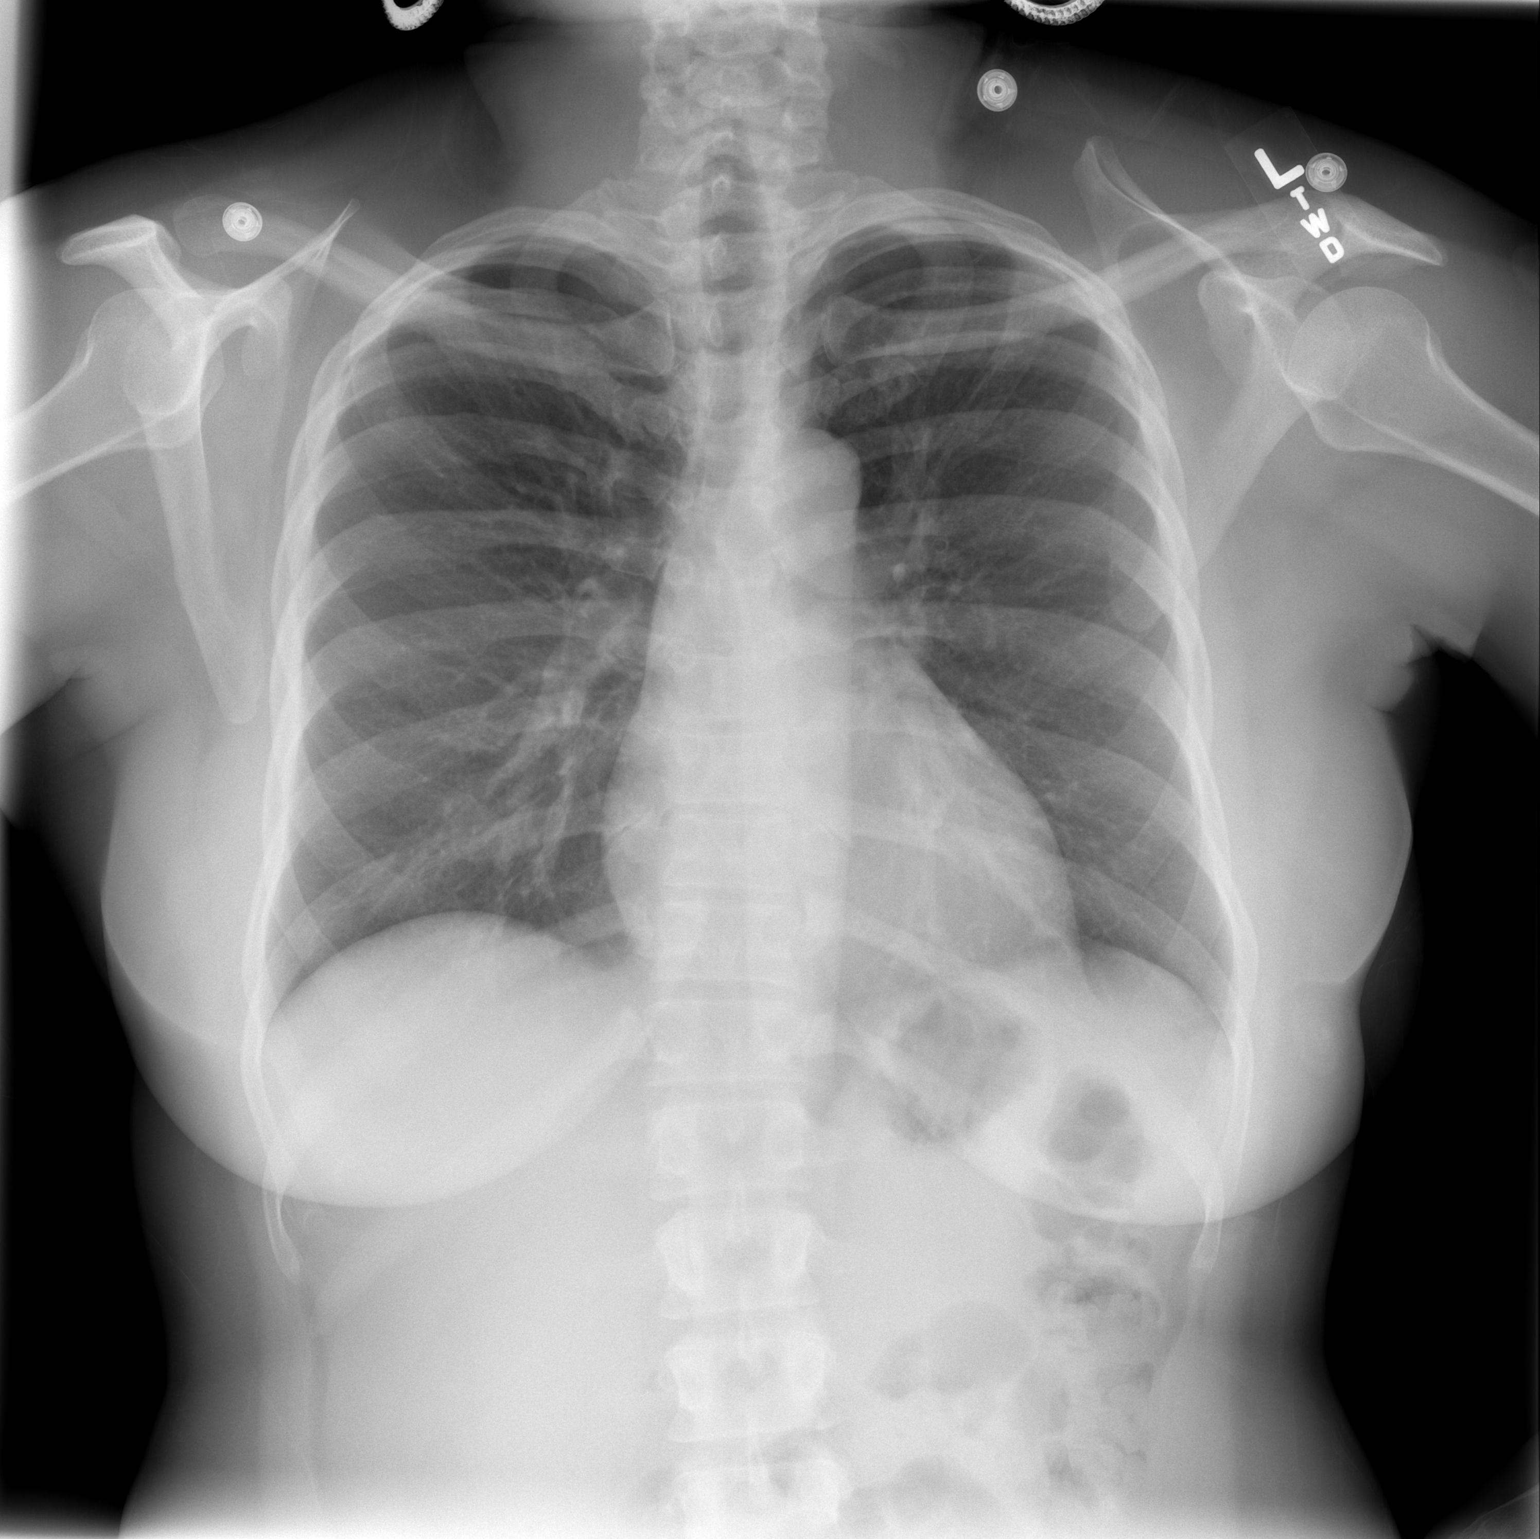

[w chest lat]
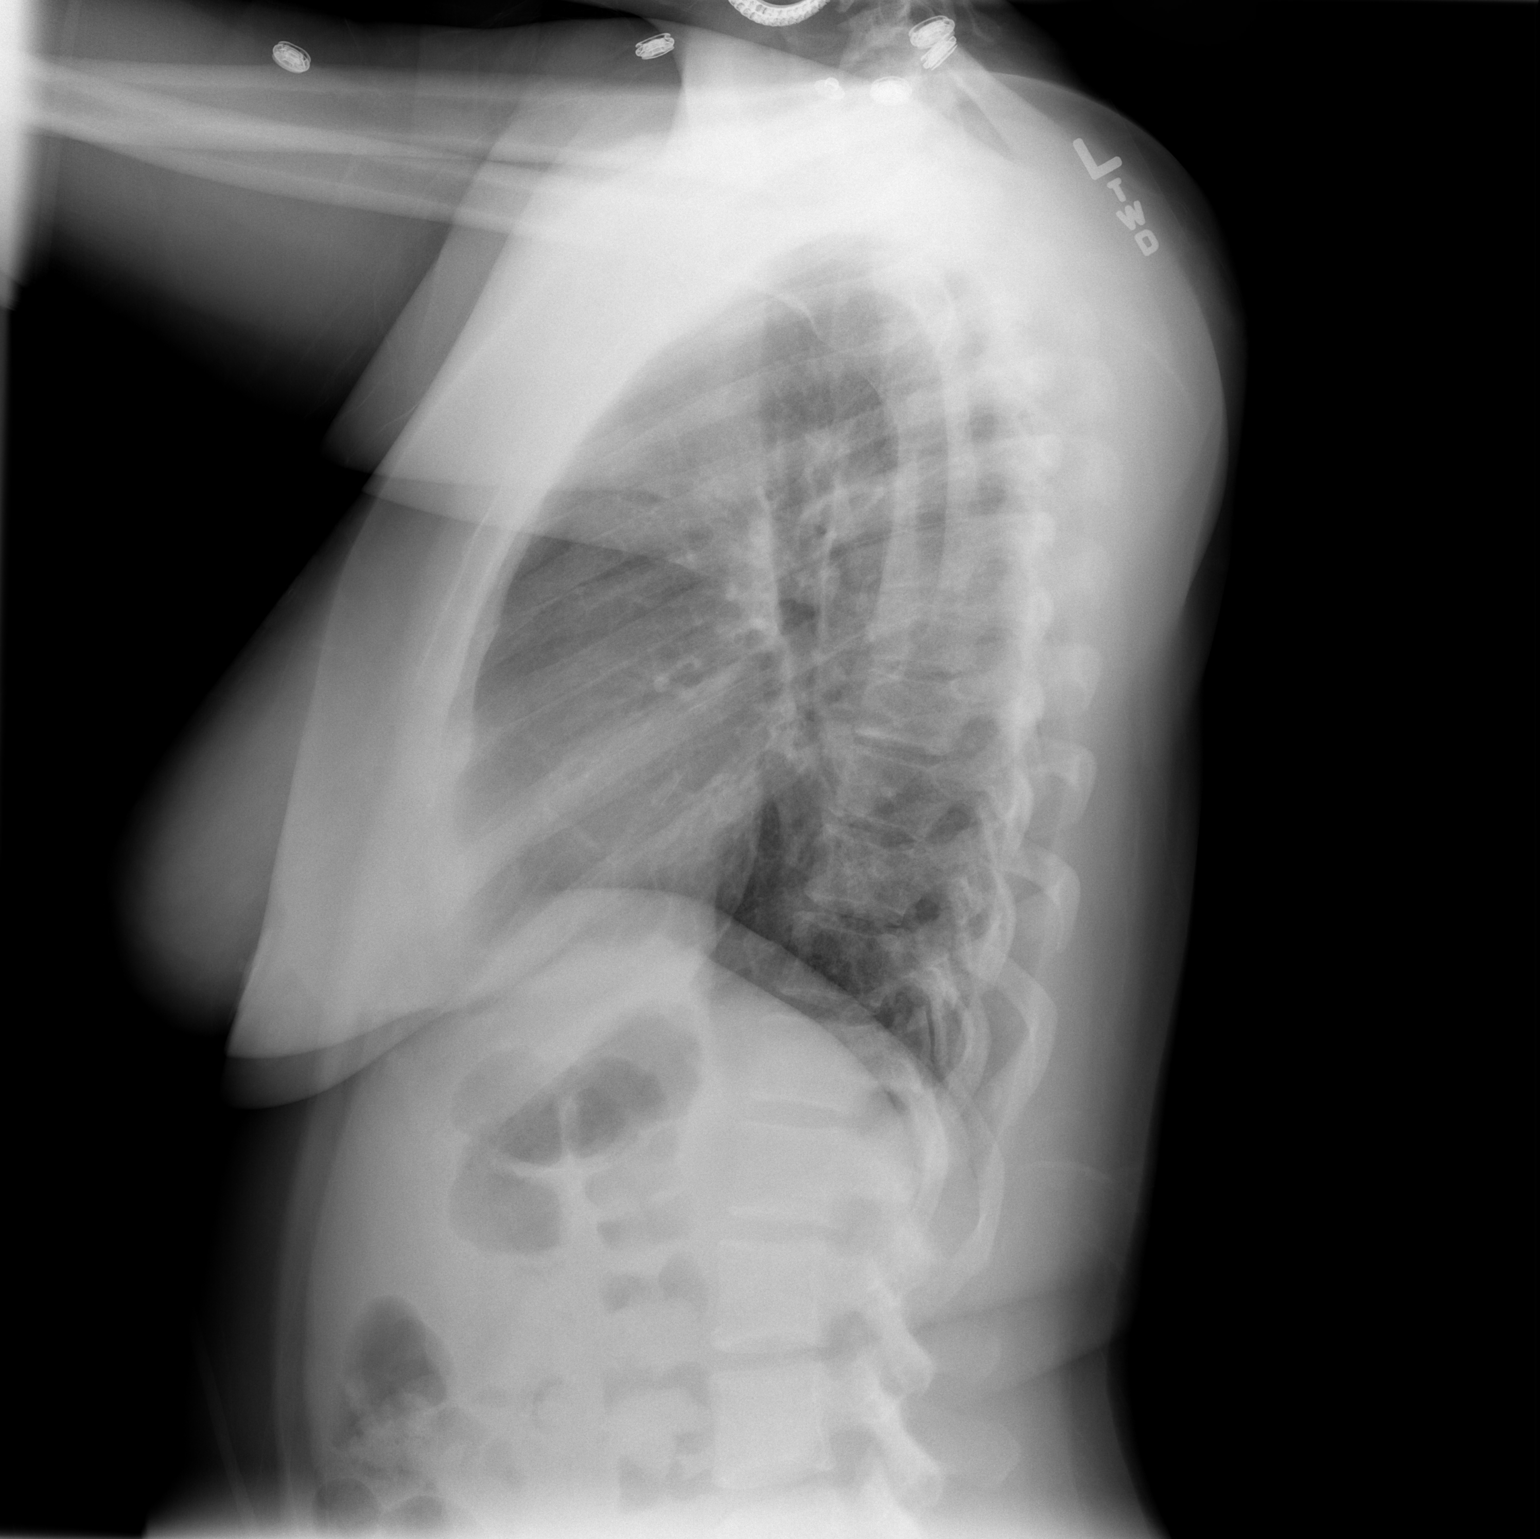

[2 of 2 positions shown; findings below may reference images not displayed]

FINDINGS: The heart and mediastinal contours are normal. The lungs
 are clear. There is no pleural effusion. Normal appearance of the
 bones. Nonobstructive bowel gas pattern visualized.
IMPRESSION: No acute cardiopulmonary disease.

## 2012-06-24 MED ORDER — HYDROCODONE-ACETAMINOPHEN 5-325 MG PO TABS: 2.0000 | ORAL_TABLET | Freq: Four times a day (QID) | ORAL | Status: DC | PRN

## 2012-06-24 MED ORDER — SULFAMETHOXAZOLE-TRIMETHOPRIM 800-160 MG PO TABS: 1.0000 | ORAL_TABLET | Freq: Two times a day (BID) | ORAL | Status: DC

## 2012-06-24 MED ORDER — HYDROCODONE-ACETAMINOPHEN 5-325 MG PO TABS
2.0000 | ORAL_TABLET | Freq: Once | ORAL | Status: AC
  Administered 2012-06-24: 2 via ORAL
  Filled 2012-06-24: qty 2

## 2012-06-24 MED ORDER — ONDANSETRON 8 MG PO TBDP
8.0000 mg | ORAL_TABLET | Freq: Once | ORAL | Status: AC
  Administered 2012-06-24: 8 mg via ORAL
  Filled 2012-06-24: qty 1

## 2012-06-24 NOTE — ED Provider Notes (Signed)
Medical screening examination/treatment/procedure(s) were performed by non-physician practitioner and as supervising physician I was immediately available for consultation/collaboration.    Vida Roller, MD 06/24/12 [phone]

## 2012-06-27 ENCOUNTER — Emergency Department (HOSPITAL_COMMUNITY)
Admission: EM | Admit: 2012-06-27 | Discharge: 2012-06-28 | Disposition: A | Discharge status: Home / Self Care | Payer: Medicaid Other | Attending: Emergency Medicine | Admitting: Emergency Medicine

## 2012-06-27 ENCOUNTER — Encounter (HOSPITAL_COMMUNITY): Payer: Self-pay | Admitting: *Deleted

## 2012-06-27 DIAGNOSIS — I10 Essential (primary) hypertension: Secondary | ICD-10-CM | POA: Insufficient documentation

## 2012-06-27 DIAGNOSIS — Z3202 Encounter for pregnancy test, result negative: Secondary | ICD-10-CM | POA: Insufficient documentation

## 2012-06-27 DIAGNOSIS — Z872 Personal history of diseases of the skin and subcutaneous tissue: Secondary | ICD-10-CM | POA: Insufficient documentation

## 2012-06-27 DIAGNOSIS — B3731 Acute candidiasis of vulva and vagina: Secondary | ICD-10-CM | POA: Insufficient documentation

## 2012-06-27 DIAGNOSIS — Z79899 Other long term (current) drug therapy: Secondary | ICD-10-CM | POA: Insufficient documentation

## 2012-06-27 DIAGNOSIS — F17200 Nicotine dependence, unspecified, uncomplicated: Secondary | ICD-10-CM | POA: Insufficient documentation

## 2012-06-27 DIAGNOSIS — N83209 Unspecified ovarian cyst, unspecified side: Secondary | ICD-10-CM | POA: Insufficient documentation

## 2012-06-27 DIAGNOSIS — R102 Pelvic and perineal pain: Secondary | ICD-10-CM

## 2012-06-27 DIAGNOSIS — R112 Nausea with vomiting, unspecified: Secondary | ICD-10-CM | POA: Insufficient documentation

## 2012-06-27 DIAGNOSIS — B373 Candidiasis of vulva and vagina: Secondary | ICD-10-CM | POA: Insufficient documentation

## 2012-06-27 DIAGNOSIS — N949 Unspecified condition associated with female genital organs and menstrual cycle: Secondary | ICD-10-CM | POA: Insufficient documentation

## 2012-06-27 DIAGNOSIS — Z8619 Personal history of other infectious and parasitic diseases: Secondary | ICD-10-CM | POA: Insufficient documentation

## 2012-06-27 DIAGNOSIS — Z90710 Acquired absence of both cervix and uterus: Secondary | ICD-10-CM | POA: Insufficient documentation

## 2012-06-27 NOTE — ED Notes (Addendum)
Pt in via EMS, per EMS- pt in c/o abd pain since Saturday, pt recently diagnosed with ovarian cyst and is c/o increased pain since Saturday with n/v, pt was seen here last week for same. Pt took a tramadol and smoked some cannabis PTA with no home relief. Pt states no vomiting since this am, pt was eating prior to arrival and EMS states patient has been able to keep that down. Pt was hypertensive during transport, pt is noncompliant with BP medication.

## 2012-06-27 NOTE — ED Notes (Signed)
GEX:BM84<XL> Expected date:06/27/12<BR> Expected time:11:17 PM<BR> Means of arrival:Ambulance<BR> Comments:<BR> abd pain, HTN

## 2012-06-28 ENCOUNTER — Emergency Department (HOSPITAL_COMMUNITY): Payer: Medicaid Other

## 2012-06-28 LAB — LIPASE, BLOOD: Lipase: 69 U/L — ABNORMAL HIGH (ref 11–59)

## 2012-06-28 LAB — URINALYSIS, ROUTINE W REFLEX MICROSCOPIC
Bilirubin Urine: NEGATIVE
Hgb urine dipstick: NEGATIVE
Protein, ur: NEGATIVE mg/dL
Urobilinogen, UA: 4 mg/dL — ABNORMAL HIGH (ref 0.0–1.0)

## 2012-06-28 LAB — CBC WITH DIFFERENTIAL/PLATELET
Basophils Absolute: 0 10*3/uL (ref 0.0–0.1)
Eosinophils Relative: 2 % (ref 0–5)
HCT: 37.8 % (ref 36.0–46.0)
Lymphocytes Relative: 34 % (ref 12–46)
Lymphs Abs: 3.5 10*3/uL (ref 0.7–4.0)
MCV: 84.9 fL (ref 78.0–100.0)
Monocytes Absolute: 0.6 10*3/uL (ref 0.1–1.0)
RDW: 13.5 % (ref 11.5–15.5)
WBC: 10.3 10*3/uL (ref 4.0–10.5)

## 2012-06-28 LAB — WET PREP, GENITAL: Trich, Wet Prep: NONE SEEN

## 2012-06-28 LAB — COMPREHENSIVE METABOLIC PANEL
BUN: 7 mg/dL (ref 6–23)
CO2: 26 mEq/L (ref 19–32)
Chloride: 100 mEq/L (ref 96–112)
Creatinine, Ser: 0.79 mg/dL (ref 0.50–1.10)
GFR calc non Af Amer: >90 mL/min (ref >90)
Glucose, Bld: 141 mg/dL — ABNORMAL HIGH (ref 70–99)
Total Bilirubin: 0.2 mg/dL — ABNORMAL LOW (ref 0.3–1.2)

## 2012-06-28 MED ORDER — OXYCODONE-ACETAMINOPHEN 5-325 MG PO TABS
2.0000 | ORAL_TABLET | Freq: Once | ORAL | Status: AC
  Administered 2012-06-28: 2 via ORAL
  Filled 2012-06-28: qty 2

## 2012-06-28 MED ORDER — FLUCONAZOLE 200 MG PO TABS
200.0000 mg | ORAL_TABLET | Freq: Once | ORAL | Status: AC
  Administered 2012-06-28: 200 mg via ORAL
  Filled 2012-06-28: qty 1

## 2012-06-28 MED ORDER — FLUCONAZOLE 200 MG PO TABS: 200.0000 mg | ORAL_TABLET | Freq: Every day | ORAL | Status: AC

## 2012-06-28 NOTE — ED Notes (Signed)
Pt c/o pain in her left arm, states it has been going on all day, EKG ordered per protocol.

## 2012-06-28 NOTE — ED Provider Notes (Signed)
History     CSN: [id number]  Arrival date & time 06/27/12  2321   First MD Initiated Contact with Patient 06/28/12 0139      Chief Complaint  Patient presents with  . Abdominal Pain   HPI  History provided by the patient. Patient is a 42 year old female with history of skin abscess, partial hysterectomy, ovarian cysts and hypertension who returns with complaints of left lower pelvic and abdomen pain that feels like previous ovarian cyst. Patient states she was diagnosed with ovarian cyst over a year ago and has had occasional pains from this. Her pain suddenly became worse over this week in the last 2 days with additional symptoms of nausea vomiting at times. Pain is described as sharp it comes in waves with a persistent ache. She was also seen 2 days ago for a skin abscess to the left groin and vaginal area. This was drained. She was given prescriptions for Vicodin and Bactrim but has not filled these prescriptions yet because she "felt too bad". Patient denies any other aggravating or alleviating factors. Denies any other associated symptoms. No fever, chills or sweats. No diarrhea constipation. No vaginal bleeding or vaginal discharge. No dysuria, hematuria urinary frequency.    Past Medical History  Diagnosis Date  . Preterm labor   . Hypertension   . Abscess     Multiple times on perineal area.  . Trichomonas     Past Surgical History  Procedure Laterality Date  . Abdominal hysterectomy    . Mouth surgery      Family History  Problem Relation Age of Onset  . Other Neg Hx   . Cancer Mother   . Cancer Maternal Aunt   . Stroke Paternal Uncle   . Diabetes Paternal Uncle   . Cancer Maternal Grandmother   . Cancer Paternal Grandmother     History  Substance Use Topics  . Smoking status: Current Every Day Smoker -- 0.75 packs/day for 16 years    Types: Cigarettes  . Smokeless tobacco: Never Used  . Alcohol Use: Yes     Comment: occasional    OB History   Grav Para  Term Preterm Abortions TAB SAB Ect Mult Living   5 4 3 1 1 1    4       Review of Systems  Constitutional: Negative for fever, chills and diaphoresis.  Cardiovascular: Negative for chest pain.  Gastrointestinal: Positive for nausea, vomiting and abdominal pain. Negative for diarrhea and constipation.  Genitourinary: Positive for pelvic pain. Negative for dysuria, frequency, hematuria, flank pain, vaginal bleeding and vaginal discharge.  All other systems reviewed and are negative.    Allergies  Review of patient's allergies indicates no known allergies.  Home Medications   Current Outpatient Rx  Name  Route  Sig  Dispense  Refill  . ibuprofen (ADVIL,MOTRIN) 600 MG tablet   Oral   Take 1 tablet (600 mg total) by mouth every 6 (six) hours as needed for pain.   30 tablet   0   . Multiple Vitamins-Minerals (WOMENS MULTIVITAMIN PLUS PO)   Oral   Take 2 capsules by mouth daily.         . traMADol (ULTRAM) 50 MG tablet   Oral   Take 1 tablet (50 mg total) by mouth every 6 (six) hours as needed for pain.   15 tablet   0   . HYDROcodone-acetaminophen (NORCO/VICODIN) 5-325 MG per tablet   Oral   Take 2 tablets by mouth every  6 (six) hours as needed for pain.   6 tablet   0   . sulfamethoxazole-trimethoprim (SEPTRA DS) 800-160 MG per tablet   Oral   Take 1 tablet by mouth every 12 (twelve) hours.   14 tablet   0     BP 162/98  Pulse 91  Temp(Src) 99.1 F (37.3 C) (Oral)  Resp 17  SpO2 96%  Physical Exam  Nursing note and vitals reviewed. Constitutional: She is oriented to person, place, and time. She appears well-developed and well-nourished. No distress.  HENT:  Head: Normocephalic.  Cardiovascular: Normal rate and regular rhythm.   Pulmonary/Chest: Effort normal and breath sounds normal.  Abdominal: Soft.  Genitourinary:  Chaperone was present. There was a large tic in amount of thick white vaginal discharge. Slight erythema to the vaginal tissue without  friability. There is left adnexal area tenderness. No large mass present. Surgically absent cervix. There is evidence of recent I&D to the left groin area. Mild tenderness. No bleeding or drainage.  Neurological: She is alert and oriented to person, place, and time.  Skin: Skin is warm and dry. No rash noted.  Psychiatric: She has a normal mood and affect. Her behavior is normal.    ED Course  Procedures  Results for orders placed during the hospital encounter of 06/27/12  WET PREP, GENITAL      Result Value Range   Yeast Wet Prep HPF POC NONE SEEN  NONE SEEN   Trich, Wet Prep NONE SEEN  NONE SEEN   Clue Cells Wet Prep HPF POC RARE (*) NONE SEEN   WBC, Wet Prep HPF POC RARE (*) NONE SEEN  CBC WITH DIFFERENTIAL      Result Value Range   WBC 10.3  4.0 - 10.5 K/uL   RBC 4.45  3.87 - 5.11 MIL/uL   Hemoglobin 13.4  12.0 - 15.0 g/dL   HCT 82.9  56.2 - 13.0 %   MCV 84.9  78.0 - 100.0 fL   MCH 30.1  26.0 - 34.0 pg   MCHC 35.4  30.0 - 36.0 g/dL   RDW 86.5  78.4 - 69.6 %   Platelets 232  150 - 400 K/uL   Neutrophils Relative % 59  43 - 77 %   Neutro Abs 6.1  1.7 - 7.7 K/uL   Lymphocytes Relative 34  12 - 46 %   Lymphs Abs 3.5  0.7 - 4.0 K/uL   Monocytes Relative 5  3 - 12 %   Monocytes Absolute 0.6  0.1 - 1.0 K/uL   Eosinophils Relative 2  0 - 5 %   Eosinophils Absolute 0.2  0.0 - 0.7 K/uL   Basophils Relative 0  0 - 1 %   Basophils Absolute 0.0  0.0 - 0.1 K/uL   Smear Review MORPHOLOGY UNREMARKABLE    COMPREHENSIVE METABOLIC PANEL      Result Value Range   Sodium 136  135 - 145 mEq/L   Potassium 3.5  3.5 - 5.1 mEq/L   Chloride 100  96 - 112 mEq/L   CO2 26  19 - 32 mEq/L   Glucose, Bld 141 (*) 70 - 99 mg/dL   BUN 7  6 - 23 mg/dL   Creatinine, Ser 2.95  0.50 - 1.10 mg/dL   Calcium 9.5  8.4 - 28.4 mg/dL   Total Protein 7.1  6.0 - 8.3 g/dL   Albumin 3.4 (*) 3.5 - 5.2 g/dL   AST 10  0 - 37 U/L  ALT 9  0 - 35 U/L   Alkaline Phosphatase 60  39 - 117 U/L   Total Bilirubin 0.2 (*)  0.3 - 1.2 mg/dL   GFR calc non Af Amer >90  >90 mL/min   GFR calc Af Amer >90  >90 mL/min  LIPASE, BLOOD      Result Value Range   Lipase 69 (*) 11 - 59 U/L  URINALYSIS, ROUTINE W REFLEX MICROSCOPIC      Result Value Range   Color, Urine YELLOW  YELLOW   APPearance CLOUDY (*) CLEAR   Specific Gravity, Urine 1.020  1.005 - 1.030   pH 6.5  5.0 - 8.0   Glucose, UA NEGATIVE  NEGATIVE mg/dL   Hgb urine dipstick NEGATIVE  NEGATIVE   Bilirubin Urine NEGATIVE  NEGATIVE   Ketones, ur NEGATIVE  NEGATIVE mg/dL   Protein, ur NEGATIVE  NEGATIVE mg/dL   Urobilinogen, UA 4.0 (*) 0.0 - 1.0 mg/dL   Nitrite NEGATIVE  NEGATIVE   Leukocytes, UA NEGATIVE  NEGATIVE  POCT PREGNANCY, URINE      Result Value Range   Preg Test, Ur NEGATIVE  NEGATIVE       US Transvaginal Non-ob  06/28/2012   *RADIOLOGY REPORT*  Clinical Data: Left pelvic pain.  TRANSABDOMINAL AND TRANSVAGINAL ULTRASOUND OF PELVIS  DOPPLER ULTRASOUND OF OVARIES  Technique:  Both transabdominal and transvaginal ultrasound examinations of the pelvis were performed. Transabdominal technique was performed for global imaging of the pelvis including uterus, ovaries, adnexal regions, and pelvic cul-de-sac.  Color and duplex Doppler ultrasound was utilized to evaluate blood flow to the ovaries.  It was necessary to proceed with endovaginal exam following the transabdominal exam to visualize the left ovary in greater detail.  Comparison:  Pelvic ultrasound performed 12/07/2011  Findings:  Uterus: Status post hysterectomy.  Right ovary:  Not visualized due to overlying bowel gas.  Left ovary: Measures 6.6 x 5.1 x 4.9 cm.  A 6.0 x 4.1 x 4.1 cm septated cyst is noted at the left ovary, with mild internal echoes.  This appears to have increased mildly in size from November 2013.  Doppler evaluation demonstrates normal arterial and venous spectral waveforms with respect to the left ovary; there is no evidence for ovarian torsion.  Other findings: A small amount  of free fluid is seen within the pelvic cul-de-sac.  IMPRESSION:  1.  6.0 x 4.1 x 4.1 cm septated cyst at the left ovary, with mild internal echoes; this has increased mildly in size from November 2013, and may explain the patient's symptoms.  It appears more complex than in November, though internal echoes were also seen in September. 2.  No evidence for ovarian torsion.  The right ovary is not visualized due to overlying bowel gas. 3.  Status post hysterectomy.   Original Report Authenticated By: Tonia Ghent, M.D.   US Pelvis Complete  06/28/2012   *RADIOLOGY REPORT*  Clinical Data: Left pelvic pain.  TRANSABDOMINAL AND TRANSVAGINAL ULTRASOUND OF PELVIS  DOPPLER ULTRASOUND OF OVARIES  Technique:  Both transabdominal and transvaginal ultrasound examinations of the pelvis were performed. Transabdominal technique was performed for global imaging of the pelvis including uterus, ovaries, adnexal regions, and pelvic cul-de-sac.  Color and duplex Doppler ultrasound was utilized to evaluate blood flow to the ovaries.  It was necessary to proceed with endovaginal exam following the transabdominal exam to visualize the left ovary in greater detail.  Comparison:  Pelvic ultrasound performed 12/07/2011  Findings:  Uterus: Status post hysterectomy.  Right ovary:  Not visualized due to overlying bowel gas.  Left ovary: Measures 6.6 x 5.1 x 4.9 cm.  A 6.0 x 4.1 x 4.1 cm septated cyst is noted at the left ovary, with mild internal echoes.  This appears to have increased mildly in size from November 2013.  Doppler evaluation demonstrates normal arterial and venous spectral waveforms with respect to the left ovary; there is no evidence for ovarian torsion.  Other findings: A small amount of free fluid is seen within the pelvic cul-de-sac.  IMPRESSION:  1.  6.0 x 4.1 x 4.1 cm septated cyst at the left ovary, with mild internal echoes; this has increased mildly in size from November 2013, and may explain the patient's symptoms.   It appears more complex than in November, though internal echoes were also seen in September. 2.  No evidence for ovarian torsion.  The right ovary is not visualized due to overlying bowel gas. 3.  Status post hysterectomy.   Original Report Authenticated By: Tonia Ghent, M.D.   [place] Art/ven Flow Abd Pelv Doppler  06/28/2012   *RADIOLOGY REPORT*  Clinical Data: Left pelvic pain.  TRANSABDOMINAL AND TRANSVAGINAL ULTRASOUND OF PELVIS  DOPPLER ULTRASOUND OF OVARIES  Technique:  Both transabdominal and transvaginal ultrasound examinations of the pelvis were performed. Transabdominal technique was performed for global imaging of the pelvis including uterus, ovaries, adnexal regions, and pelvic cul-de-sac.  Color and duplex Doppler ultrasound was utilized to evaluate blood flow to the ovaries.  It was necessary to proceed with endovaginal exam following the transabdominal exam to visualize the left ovary in greater detail.  Comparison:  Pelvic ultrasound performed 12/07/2011  Findings:  Uterus: Status post hysterectomy.  Right ovary:  Not visualized due to overlying bowel gas.  Left ovary: Measures 6.6 x 5.1 x 4.9 cm.  A 6.0 x 4.1 x 4.1 cm septated cyst is noted at the left ovary, with mild internal echoes.  This appears to have increased mildly in size from November 2013.  Doppler evaluation demonstrates normal arterial and venous spectral waveforms with respect to the left ovary; there is no evidence for ovarian torsion.  Other findings: A small amount of free fluid is seen within the pelvic cul-de-sac.  IMPRESSION:  1.  6.0 x 4.1 x 4.1 cm septated cyst at the left ovary, with mild internal echoes; this has increased mildly in size from November 2013, and may explain the patient's symptoms.  It appears more complex than in November, though internal echoes were also seen in September. 2.  No evidence for ovarian torsion.  The right ovary is not visualized due to overlying bowel gas. 3.  Status post hysterectomy.    Original Report Authenticated By: Tonia Ghent, M.D.     1. Yeast vaginitis   2. Pelvic pain   3. Ovarian cyst       MDM  Patient seen and evaluated. The patient appears well in no acute distress. She has her recent discharge paperwork and prescriptions with her. She has not had a chance to fill her Vicodin or Bactrim.  Pain improved her to Percocet.  Patient with similar-sized ovarian cyst. Slight evidence of possible leaking from the cyst which may be causing irritation to the abdominal cavity explaining her nausea vomiting. Labs otherwise unremarkable. On exam she has significant thick white vaginal discharge. Will treat for yeast infection. Patient will be given a OB/GYN referral.     Date: 06/28/2012  Rate: 90  Rhythm: normal sinus rhythm  QRS Axis:  normal  Intervals: normal  ST/T Wave abnormalities: normal  Conduction Disutrbances:none  Narrative Interpretation:   Old EKG Reviewed: None available    Angus Seller, PA-C 06/28/12 2040

## 2012-06-28 NOTE — ED Notes (Signed)
[id number]<JY> Expected date:<BR> Expected time:<BR> Means of arrival:<BR> Comments:<BR> Hold for 5

## 2012-06-29 NOTE — ED Provider Notes (Signed)
Medical screening examination/treatment/procedure(s) were performed by non-physician practitioner and as supervising physician I was immediately available for consultation/collaboration.  Flint Melter, MD 06/29/12 [facility]

## 2013-02-14 ENCOUNTER — Encounter: Payer: Self-pay | Admitting: Family

## 2013-02-14 ENCOUNTER — Ambulatory Visit (INDEPENDENT_AMBULATORY_CARE_PROVIDER_SITE_OTHER): Payer: BC Managed Care – PPO | Admitting: Family

## 2013-02-14 VITALS — BP 148/90 | HR 79 | Ht 63.0 in | Wt 172.0 lb

## 2013-02-14 DIAGNOSIS — I10 Essential (primary) hypertension: Secondary | ICD-10-CM

## 2013-02-14 MED ORDER — LOSARTAN POTASSIUM-HCTZ 100-25 MG PO TABS: 1.0000 | ORAL_TABLET | Freq: Every day | ORAL | Status: DC

## 2013-02-14 NOTE — Patient Instructions (Signed)
1. Hyzaar 1/2 tab daily.    Sodium-Controlled Diet Sodium is a mineral. It is found in many foods. Sodium may be found naturally or added during the making of a food. The most common form of sodium is salt, which is made up of sodium and chloride. Reducing your sodium intake involves changing your eating habits. The following guidelines will help you reduce the sodium in your diet:  Stop using the salt shaker.  Use salt sparingly in cooking and baking.  Substitute with sodium-free seasonings and spices.  Do not use a salt substitute (potassium chloride) without your caregiver's permission.  Include a variety of fresh, unprocessed foods in your diet.  Limit the use of processed and convenience foods that are high in sodium. USE THE FOLLOWING FOODS SPARINGLY: Breads/Starches  Commercial bread stuffing, commercial pancake or waffle mixes, coating mixes. Waffles. Croutons. Prepared (boxed or frozen) potato, rice, or noodle mixes that contain salt or sodium. Salted [place] fries or hash browns. Salted popcorn, breads, crackers, chips, or snack foods. Vegetables  Vegetables canned with salt or prepared in cream, butter, or cheese sauces. Sauerkraut. Tomato or vegetable juices canned with salt.  Fresh vegetables are allowed if rinsed thoroughly. Fruit  Fruit is okay to eat. Meat and Meat Substitutes  Salted or smoked meats, such as bacon or Canadian bacon, chipped or corned beef, hot dogs, salt pork, luncheon meats, pastrami, ham, or sausage. Canned or smoked fish, poultry, or meat. Processed cheese or cheese spreads, blue or Roquefort cheese. Battered or frozen fish products. Prepared spaghetti sauce. Baked beans. Reuben sandwiches. Salted nuts. Caviar. Milk  Limit buttermilk to 1 cup per week. Soups and Combination Foods  Bouillon cubes, canned or dried soups, broth, consomm. Convenience (frozen or packaged) dinners with more than 600 mg sodium. Pot pies, pizza, Asian food, fast food  cheeseburgers, and specialty sandwiches. Desserts and Sweets  Regular (salted) desserts, pie, commercial fruit snack pies, commercial snack cakes, canned puddings.  Eat desserts and sweets in moderation. Fats and Oils  Gravy mixes or canned gravy. No more than 1 to 2 tbs of salad dressing. Chip dips.  Eat fats and oils in moderation. Beverages  See those listed under the vegetables and milk groups. Condiments  Ketchup, mustard, meat sauces, salsa, regular (salted) and lite soy sauce or mustard. Dill pickles, olives, meat tenderizer. Prepared horseradish or pickle relish. Dutch-processed cocoa. Baking powder or baking soda used medicinally. Worcestershire sauce. "Light" salt. Salt substitute, unless approved by your caregiver. Document Released: 07/11/2001 Document Revised: 04/13/2011 Document Reviewed: 02/11/2009 [facility] Patient Information 2014 [employer], [place].

## 2013-02-14 NOTE — Progress Notes (Signed)
   Subjective:    Patient ID: Yolanda Richard, female    DOB: 18-Feb-1970, 43 y.o.   MRN: [id number]  HPI 43 year old AAF, nonsmoker, is in today to be established. She has a history of hypertension. She's supposed to take blood pressure medication but has not been on medicine for quite some time.She has not had a pap smear in 2 years. Mammogram was 2 years ago.    Review of Systems  Constitutional: Negative.   HENT: Negative.   Respiratory: Negative.   Cardiovascular: Negative.   Gastrointestinal: Negative.   Endocrine: Negative.   Genitourinary: Negative.   Musculoskeletal: Negative.   Skin: Negative.   Allergic/Immunologic: Negative.   Neurological: Negative.   Hematological: Negative.   Psychiatric/Behavioral: Negative.    Past Medical History  Diagnosis Date  . Preterm labor   . Hypertension   . Abscess     Multiple times on perineal area.  . Trichomonas     History   Social History  . Marital Status: Single    Spouse Name: N/A    Number of Children: N/A  . Years of Education: N/A   Occupational History  . Not on file.   Social History Main Topics  . Smoking status: Current Every Day Smoker -- 0.75 packs/day for 16 years    Types: Cigarettes  . Smokeless tobacco: Never Used  . Alcohol Use: Yes     Comment: occasional  . Drug Use: No  . Sexual Activity: Yes    Birth Control/ Protection: Condom, Surgical   Other Topics Concern  . Not on file   Social History Narrative  . No narrative on file    Past Surgical History  Procedure Laterality Date  . Abdominal hysterectomy    . Mouth surgery      Family History  Problem Relation Age of Onset  . Other Neg Hx   . Cancer Mother   . Cancer Maternal Aunt   . Stroke Paternal Uncle   . Diabetes Paternal Uncle   . Cancer Maternal Grandmother   . Cancer Paternal Grandmother     No Known Allergies  No current outpatient prescriptions on file prior to visit.   No current facility-administered  medications on file prior to visit.    BP 148/90  Pulse 79  Ht 5\' 3"  (1.6 m)  Wt 172 lb (78.019 kg)  BMI 30.48 kg/m2chart    Objective:   Physical Exam  Constitutional: She is oriented to person, place, and time. She appears well-developed and well-nourished.  HENT:  Right Ear: External ear normal.  Left Ear: External ear normal.  Nose: Nose normal.  Mouth/Throat: Oropharynx is clear and moist.  Neck: Normal range of motion. Neck supple.  Cardiovascular: Normal rate, regular rhythm and normal heart sounds.   Pulmonary/Chest: Effort normal and breath sounds normal.  Abdominal: Soft. Bowel sounds are normal.  Musculoskeletal: Normal range of motion.  Neurological: She is alert and oriented to person, place, and time.  Skin: Skin is warm and dry.  Psychiatric: She has a normal mood and affect.          Assessment & Plan:  Assessment:  1. Hypertension-uncontrolled  Plan: Return for CPX. Start Hyzaar 100/25 1/2 tab once a day. CMP sent.

## 2013-02-15 ENCOUNTER — Telehealth: Payer: Self-pay | Admitting: Internal Medicine

## 2013-02-15 LAB — COMPREHENSIVE METABOLIC PANEL
ALT: 13 U/L (ref 0–35)
AST: 16 U/L (ref 0–37)
Albumin: 4.2 g/dL (ref 3.5–5.2)
Alkaline Phosphatase: 58 U/L (ref 39–117)
BILIRUBIN TOTAL: 0.5 mg/dL (ref 0.3–1.2)
BUN: 7 mg/dL (ref 6–23)
CALCIUM: 9.3 mg/dL (ref 8.4–10.5)
CHLORIDE: 106 meq/L (ref 96–112)
CO2: 25 meq/L (ref 19–32)
CREATININE: 0.8 mg/dL (ref 0.4–1.2)
GFR: 96.18 mL/min (ref >60.00)
GLUCOSE: 90 mg/dL (ref 70–99)
Potassium: 4.4 mEq/L (ref 3.5–5.1)
Sodium: 137 mEq/L (ref 135–145)
Total Protein: 7.7 g/dL (ref 6.0–8.3)

## 2013-02-15 NOTE — Telephone Encounter (Signed)
Relevant patient education assigned to patient using Emmi. ° °

## 2013-02-22 ENCOUNTER — Telehealth: Payer: Self-pay | Admitting: Family

## 2013-02-22 MED ORDER — LISINOPRIL-HYDROCHLOROTHIAZIDE 10-12.5 MG PO TABS: 1.0000 | ORAL_TABLET | Freq: Every day | ORAL | Status: DC

## 2013-02-22 NOTE — Telephone Encounter (Signed)
Medicaid denied Hyzaar.  Pt must try and fail at least one ACE Inhibitor first.

## 2013-02-24 ENCOUNTER — Other Ambulatory Visit: Payer: Self-pay | Admitting: Family

## 2013-02-24 ENCOUNTER — Encounter: Payer: Self-pay | Admitting: Family

## 2013-02-24 ENCOUNTER — Other Ambulatory Visit (HOSPITAL_COMMUNITY)
Admission: RE | Admit: 2013-02-24 | Discharge: 2013-02-24 | Disposition: A | Discharge status: Home / Self Care | Payer: BC Managed Care – PPO | Source: Ambulatory Visit | Attending: Family | Admitting: Family

## 2013-02-24 ENCOUNTER — Ambulatory Visit (INDEPENDENT_AMBULATORY_CARE_PROVIDER_SITE_OTHER): Payer: BC Managed Care – PPO | Admitting: Family

## 2013-02-24 VITALS — BP 148/96 | HR 96 | Ht 63.0 in | Wt 172.0 lb

## 2013-02-24 DIAGNOSIS — I10 Essential (primary) hypertension: Secondary | ICD-10-CM

## 2013-02-24 DIAGNOSIS — Z0000 Encounter for general adult medical examination without abnormal findings: Secondary | ICD-10-CM

## 2013-02-24 DIAGNOSIS — Z113 Encounter for screening for infections with a predominantly sexual mode of transmission: Secondary | ICD-10-CM | POA: Insufficient documentation

## 2013-02-24 DIAGNOSIS — Z124 Encounter for screening for malignant neoplasm of cervix: Secondary | ICD-10-CM

## 2013-02-24 DIAGNOSIS — Z01419 Encounter for gynecological examination (general) (routine) without abnormal findings: Secondary | ICD-10-CM | POA: Insufficient documentation

## 2013-02-24 DIAGNOSIS — N760 Acute vaginitis: Secondary | ICD-10-CM | POA: Insufficient documentation

## 2013-02-24 DIAGNOSIS — Z1231 Encounter for screening mammogram for malignant neoplasm of breast: Secondary | ICD-10-CM

## 2013-02-24 LAB — CBC WITH DIFFERENTIAL/PLATELET
BASOS ABS: 0.1 10*3/uL (ref 0.0–0.1)
Basophils Relative: 0.5 % (ref 0.0–3.0)
EOS PCT: 1.8 % (ref 0.0–5.0)
Eosinophils Absolute: 0.2 10*3/uL (ref 0.0–0.7)
HEMATOCRIT: 41.3 % (ref 36.0–46.0)
HEMOGLOBIN: 14.1 g/dL (ref 12.0–15.0)
LYMPHS ABS: 3 10*3/uL (ref 0.7–4.0)
LYMPHS PCT: 25.6 % (ref 12.0–46.0)
MCHC: 34 g/dL (ref 30.0–36.0)
MCV: 88.5 fl (ref 78.0–100.0)
MONOS PCT: 2.9 % — AB (ref 3.0–12.0)
Monocytes Absolute: 0.3 10*3/uL (ref 0.1–1.0)
NEUTROS ABS: 8 10*3/uL — AB (ref 1.4–7.7)
Neutrophils Relative %: 69.2 % (ref 43.0–77.0)
Platelets: 254 10*3/uL (ref 150.0–400.0)
RBC: 4.67 Mil/uL (ref 3.87–5.11)
RDW: 14.1 % (ref 11.5–14.6)
WBC: 11.6 10*3/uL — AB (ref 4.5–10.5)

## 2013-02-24 LAB — COMPREHENSIVE METABOLIC PANEL
ALK PHOS: 55 U/L (ref 39–117)
ALT: 14 U/L (ref 0–35)
AST: 14 U/L (ref 0–37)
Albumin: 3.8 g/dL (ref 3.5–5.2)
BILIRUBIN TOTAL: 0.4 mg/dL (ref 0.3–1.2)
BUN: 8 mg/dL (ref 6–23)
CO2: 27 meq/L (ref 19–32)
CREATININE: 0.9 mg/dL (ref 0.4–1.2)
Calcium: 9.1 mg/dL (ref 8.4–10.5)
Chloride: 106 mEq/L (ref 96–112)
GFR: 91.14 mL/min (ref >60.00)
GLUCOSE: 118 mg/dL — AB (ref 70–99)
Potassium: 4.1 mEq/L (ref 3.5–5.1)
SODIUM: 141 meq/L (ref 135–145)
TOTAL PROTEIN: 7.2 g/dL (ref 6.0–8.3)

## 2013-02-24 LAB — LIPID PANEL
CHOLESTEROL: 183 mg/dL (ref 0–200)
HDL: 41.8 mg/dL (ref >39.00)
LDL CALC: 108 mg/dL — AB (ref 0–99)
Total CHOL/HDL Ratio: 4
Triglycerides: 165 mg/dL — ABNORMAL HIGH (ref 0.0–149.0)
VLDL: 33 mg/dL (ref 0.0–40.0)

## 2013-02-24 LAB — TSH: TSH: 0.71 u[IU]/mL (ref 0.35–5.50)

## 2013-02-24 NOTE — Progress Notes (Signed)
Pre visit review using our clinic review tool, if applicable. No additional management support is needed unless otherwise documented below in the visit note. 

## 2013-02-24 NOTE — Progress Notes (Signed)
Subjective:    Patient ID: Yolanda Richard, female    DOB: October 06, 1970, 43 y.o.   MRN: [id number]  HPI 43 year old AAF, nonsmoker, is in today for a routine physical examination for this healthy  Female. Reviewed all health maintenance protocols including mammography colonoscopy bone density and reviewed appropriate screening labs. Her immunization history was reviewed as well as her current medications and allergies refills of her chronic medications were given and the plan for yearly health maintenance was discussed all orders and referrals were made as appropriate.   Review of Systems  Constitutional: Negative.   HENT: Negative.   Eyes: Negative.   Respiratory: Negative.   Cardiovascular: Negative.   Gastrointestinal: Negative.   Endocrine: Negative.   Genitourinary: Negative.   Musculoskeletal: Negative.   Skin: Negative.   Allergic/Immunologic: Negative.   Neurological: Negative.   Hematological: Negative.   Psychiatric/Behavioral: Negative.    Past Medical History  Diagnosis Date  . Preterm labor   . Hypertension   . Abscess     Multiple times on perineal area.  . Trichomonas     History   Social History  . Marital Status: Single    Spouse Name: N/A    Number of Children: N/A  . Years of Education: N/A   Occupational History  . Not on file.   Social History Main Topics  . Smoking status: Current Every Day Smoker -- 0.75 packs/day for 16 years    Types: Cigarettes  . Smokeless tobacco: Never Used  . Alcohol Use: Yes     Comment: occasional  . Drug Use: No  . Sexual Activity: Yes    Birth Control/ Protection: Condom, Surgical   Other Topics Concern  . Not on file   Social History Narrative  . No narrative on file    Past Surgical History  Procedure Laterality Date  . Abdominal hysterectomy    . Mouth surgery      Family History  Problem Relation Age of Onset  . Other Neg Hx   . Cancer Mother   . Cancer Maternal Aunt   . Stroke Paternal  Uncle   . Diabetes Paternal Uncle   . Cancer Maternal Grandmother   . Cancer Paternal Grandmother     No Known Allergies  Current Outpatient Prescriptions on File Prior to Visit  Medication Sig Dispense Refill  . lisinopril-hydrochlorothiazide (PRINZIDE,ZESTORETIC) 10-12.5 MG per tablet Take 1 tablet by mouth daily.  30 tablet  3   No current facility-administered medications on file prior to visit.    BP 148/96  Pulse 96  Ht 5\' 3"  (1.6 m)  Wt 172 lb (78.019 kg)  BMI 30.48 kg/m2chart    Objective:   Physical Exam  Constitutional: She is oriented to person, place, and time. She appears well-developed and well-nourished.  HENT:  Head: Normocephalic.  Right Ear: External ear normal.  Left Ear: External ear normal.  Nose: Nose normal.  Mouth/Throat: Oropharynx is clear and moist.  Eyes: Conjunctivae and EOM are normal. Pupils are equal, round, and reactive to light.  Neck: Normal range of motion. Neck supple. No thyromegaly present.  Cardiovascular: Normal rate, regular rhythm and normal heart sounds.   Pulmonary/Chest: Effort normal and breath sounds normal.  Abdominal: Soft. Bowel sounds are normal. She exhibits no distension. There is no tenderness. There is no rebound.  Musculoskeletal: Normal range of motion. She exhibits no edema and no tenderness.  Neurological: She is alert and oriented to person, place, and time. She has  normal reflexes. She displays normal reflexes. No cranial nerve deficit. Coordination normal.  Skin: Skin is warm and dry.  Psychiatric: She has a normal mood and affect.          Assessment & Plan:  Assessment:  1. CPX 2. Hypertension   Plan: Encouraged a healthy diet, low sodium, and exercise. Strongly encouraged patient to start blood pressure medication. Pap smear sent. Call the office with any questions or concerns. Recheck in 2-3 weeks. Safe sex practices.

## 2013-02-24 NOTE — Patient Instructions (Signed)
Sodium-Controlled Diet Sodium is a mineral. It is found in many foods. Sodium may be found naturally or added during the making of a food. The most common form of sodium is salt, which is made up of sodium and chloride. Reducing your sodium intake involves changing your eating habits. The following guidelines will help you reduce the sodium in your diet:  Stop using the salt shaker.  Use salt sparingly in cooking and baking.  Substitute with sodium-free seasonings and spices.  Do not use a salt substitute (potassium chloride) without your caregiver's permission.  Include a variety of fresh, unprocessed foods in your diet.  Limit the use of processed and convenience foods that are high in sodium. USE THE FOLLOWING FOODS SPARINGLY: Breads/Starches  Commercial bread stuffing, commercial pancake or waffle mixes, coating mixes. Waffles. Croutons. Prepared (boxed or frozen) potato, rice, or noodle mixes that contain salt or sodium. Salted French fries or hash browns. Salted popcorn, breads, crackers, chips, or snack foods. Vegetables  Vegetables canned with salt or prepared in cream, butter, or cheese sauces. Sauerkraut. Tomato or vegetable juices canned with salt.  Fresh vegetables are allowed if rinsed thoroughly. Fruit  Fruit is okay to eat. Meat and Meat Substitutes  Salted or smoked meats, such as bacon or Canadian bacon, chipped or corned beef, hot dogs, salt pork, luncheon meats, pastrami, ham, or sausage. Canned or smoked fish, poultry, or meat. Processed cheese or cheese spreads, blue or Roquefort cheese. Battered or frozen fish products. Prepared spaghetti sauce. Baked beans. Reuben sandwiches. Salted nuts. Caviar. Milk  Limit buttermilk to 1 cup per week. Soups and Combination Foods  Bouillon cubes, canned or dried soups, broth, consomm. Convenience (frozen or packaged) dinners with more than 600 mg sodium. Pot pies, pizza, Asian food, fast food cheeseburgers, and specialty  sandwiches. Desserts and Sweets  Regular (salted) desserts, pie, commercial fruit snack pies, commercial snack cakes, canned puddings.  Eat desserts and sweets in moderation. Fats and Oils  Gravy mixes or canned gravy. No more than 1 to 2 tbs of salad dressing. Chip dips.  Eat fats and oils in moderation. Beverages  See those listed under the vegetables and milk groups. Condiments  Ketchup, mustard, meat sauces, salsa, regular (salted) and lite soy sauce or mustard. Dill pickles, olives, meat tenderizer. Prepared horseradish or pickle relish. Dutch-processed cocoa. Baking powder or baking soda used medicinally. Worcestershire sauce. "Light" salt. Salt substitute, unless approved by your caregiver. Document Released: 07/11/2001 Document Revised: 04/13/2011 Document Reviewed: 02/11/2009 [employer] Patient Information 2014 [employer].  

## 2013-02-27 ENCOUNTER — Telehealth: Payer: Self-pay | Admitting: Family

## 2013-02-27 NOTE — Telephone Encounter (Signed)
Relevant patient education assigned to patient using Emmi. ° °

## 2013-02-28 ENCOUNTER — Other Ambulatory Visit: Payer: Self-pay | Admitting: Family

## 2013-02-28 MED ORDER — METRONIDAZOLE 0.75 % VA GEL: 1.0000 | Freq: Every day | VAGINAL | Status: AC

## 2013-03-01 ENCOUNTER — Telehealth: Payer: Self-pay

## 2013-03-01 MED ORDER — CLINDAMYCIN HCL 300 MG PO CAPS: 300.0000 mg | ORAL_CAPSULE | Freq: Three times a day (TID) | ORAL | Status: DC

## 2013-03-01 NOTE — Telephone Encounter (Signed)
Pt would like clindamycin please

## 2013-03-03 ENCOUNTER — Telehealth: Payer: Self-pay | Admitting: Internal Medicine

## 2013-03-03 NOTE — Telephone Encounter (Signed)
Relevant patient education assigned to patient using Emmi. ° °

## 2013-03-23 ENCOUNTER — Ambulatory Visit
Admission: RE | Admit: 2013-03-23 | Discharge: 2013-03-23 | Disposition: A | Discharge status: Home / Self Care | Payer: BC Managed Care – PPO | Source: Ambulatory Visit | Attending: Family | Admitting: Family

## 2013-03-23 DIAGNOSIS — Z1231 Encounter for screening mammogram for malignant neoplasm of breast: Secondary | ICD-10-CM

## 2013-04-10 ENCOUNTER — Other Ambulatory Visit: Payer: Self-pay | Admitting: Family

## 2013-04-10 ENCOUNTER — Encounter: Payer: Self-pay | Admitting: Family

## 2013-04-10 MED ORDER — VALACYCLOVIR HCL 1 G PO TABS: 1000.0000 mg | ORAL_TABLET | Freq: Three times a day (TID) | ORAL | Status: DC

## 2013-05-09 ENCOUNTER — Emergency Department (HOSPITAL_COMMUNITY)
Admission: EM | Admit: 2013-05-09 | Discharge: 2013-05-09 | Disposition: A | Discharge status: Home / Self Care | Payer: BC Managed Care – PPO | Source: Home / Self Care | Attending: Family Medicine | Admitting: Family Medicine

## 2013-05-09 ENCOUNTER — Encounter (HOSPITAL_COMMUNITY): Payer: Self-pay | Admitting: Emergency Medicine

## 2013-05-09 ENCOUNTER — Emergency Department (INDEPENDENT_AMBULATORY_CARE_PROVIDER_SITE_OTHER): Payer: BC Managed Care – PPO

## 2013-05-09 DIAGNOSIS — R1310 Dysphagia, unspecified: Secondary | ICD-10-CM

## 2013-05-09 LAB — POCT RAPID STREP A: STREPTOCOCCUS, GROUP A SCREEN (DIRECT): NEGATIVE

## 2013-05-09 MED ORDER — PANTOPRAZOLE SODIUM 40 MG PO TBEC: 40.0000 mg | DELAYED_RELEASE_TABLET | Freq: Every day | ORAL | Status: DC

## 2013-05-09 MED ORDER — GI COCKTAIL ~~LOC~~
ORAL | Status: AC
  Filled 2013-05-09: qty 30

## 2013-05-09 MED ORDER — GI COCKTAIL ~~LOC~~
30.0000 mL | Freq: Once | ORAL | Status: AC
  Administered 2013-05-09: 30 mL via ORAL

## 2013-05-09 NOTE — ED Provider Notes (Signed)
CSN: [id number]     Arrival date & time 05/09/13  1328 History   First MD Initiated Contact with Patient 05/09/13 1516     Chief Complaint  Patient presents with  . Sore Throat   (Consider location/radiation/quality/duration/timing/severity/associated sxs/prior Treatment) Patient is a 43 y.o. female presenting with pharyngitis. The history is provided by the patient.  Sore Throat This is a new problem. The current episode started 6 to 12 hours ago (awoke this am with lump feeling in left base of neck, hurts to swallow.). The problem has not changed since onset.Pertinent negatives include no shortness of breath. The symptoms are aggravated by swallowing.    Past Medical History  Diagnosis Date  . Preterm labor   . Hypertension   . Abscess     Multiple times on perineal area.  . Trichomonas    Past Surgical History  Procedure Laterality Date  . Abdominal hysterectomy    . Mouth surgery     Family History  Problem Relation Age of Onset  . Other Neg Hx   . Cancer Mother   . Cancer Maternal Aunt   . Stroke Paternal Uncle   . Diabetes Paternal Uncle   . Cancer Maternal Grandmother   . Cancer Paternal Grandmother    History  Substance Use Topics  . Smoking status: Current Every Day Smoker -- 0.75 packs/day for 16 years    Types: Cigarettes  . Smokeless tobacco: Never Used  . Alcohol Use: Yes     Comment: occasional   OB History   Grav Para Term Preterm Abortions TAB SAB Ect Mult Living   5 4 3 1 1 1    4      Review of Systems  Constitutional: Negative for fever.  HENT: Positive for trouble swallowing. Negative for congestion, drooling, facial swelling, rhinorrhea, sneezing and sore throat.   Respiratory: Negative for shortness of breath.   Musculoskeletal: Negative for neck pain.  Skin: Negative for rash.    Allergies  Review of patient's allergies indicates no known allergies.  Home Medications   Current Outpatient Rx  Name  Route  Sig  Dispense  Refill  .  clindamycin (CLEOCIN) 300 MG capsule   Oral   Take 1 capsule (300 mg total) by mouth 3 (three) times daily.   21 capsule   0   . lisinopril-hydrochlorothiazide (PRINZIDE,ZESTORETIC) 10-12.5 MG per tablet   Oral   Take 1 tablet by mouth daily.   30 tablet   3   . valACYclovir (VALTREX) 1000 MG tablet   Oral   Take 1 tablet (1,000 mg total) by mouth 3 (three) times daily.   21 tablet   2    BP 134/87  Pulse 86  Temp(Src) 98.5 F (36.9 C) (Oral)  Resp 16  SpO2 100% Physical Exam  Nursing note and vitals reviewed. Constitutional: She is oriented to person, place, and time. She appears well-developed and well-nourished. No distress.  HENT:  Head: Normocephalic.  Right Ear: External ear normal.  Left Ear: External ear normal.  Mouth/Throat: Oropharynx is clear and moist.  Eyes: Pupils are equal, round, and reactive to light.  Neck: Normal range of motion. Neck supple.  Cardiovascular: Regular rhythm and normal heart sounds.   Pulmonary/Chest: Effort normal and breath sounds normal.  Abdominal: Soft. Bowel sounds are normal. There is no tenderness.  Neurological: She is alert and oriented to person, place, and time.  Skin: Skin is warm and dry.    ED Course  Procedures (including  critical care time) Labs Review Labs Reviewed - No data to display Imaging Review No results found. Strep neg. X-rays reviewed and report per radiologist.  MDM  No diagnosis found. Sx sl improved at d/c.    Linna HoffJames D Governor Matos, MD 05/09/13 [id number]

## 2013-05-09 NOTE — ED Notes (Signed)
Pt reports a "lump" in her throat onset this am Sxs also include odynophagia, hoarseness, vomiting  Denies f/v/n/d, cold sxs Alert w/no signs of acute distress.

## 2013-05-09 NOTE — Discharge Instructions (Signed)
Use medicine daily and stop current lisinopril for bp and see your doctor for recheck.see ent doctor if further problems.

## 2013-05-11 LAB — CULTURE, GROUP A STREP

## 2013-05-19 ENCOUNTER — Telehealth: Payer: Self-pay | Admitting: Family

## 2013-05-19 NOTE — Telephone Encounter (Addendum)
Pt was seen in urgent care  last wk and bp med was discontinue. Pt needs post urgent care fup. Pt can only do mornings can I use 2 sda slot? Pt would like next wk

## 2013-05-20 NOTE — Telephone Encounter (Signed)
Ok to use 2 SDA's

## 2013-05-22 NOTE — Telephone Encounter (Signed)
Pt has been sch for 05-24-13

## 2013-05-24 ENCOUNTER — Ambulatory Visit (INDEPENDENT_AMBULATORY_CARE_PROVIDER_SITE_OTHER): Payer: BC Managed Care – PPO | Admitting: Family

## 2013-05-24 ENCOUNTER — Encounter: Payer: Self-pay | Admitting: Family

## 2013-05-24 VITALS — BP 144/98 | HR 71 | Temp 98.6°F | Ht 63.0 in | Wt 174.0 lb

## 2013-05-24 DIAGNOSIS — J309 Allergic rhinitis, unspecified: Secondary | ICD-10-CM

## 2013-05-24 DIAGNOSIS — I10 Essential (primary) hypertension: Secondary | ICD-10-CM

## 2013-05-24 DIAGNOSIS — F411 Generalized anxiety disorder: Secondary | ICD-10-CM

## 2013-05-24 MED ORDER — BUPROPION HCL ER (XL) 150 MG PO TB24: 150.0000 mg | ORAL_TABLET | Freq: Every day | ORAL | Status: DC

## 2013-05-24 MED ORDER — HYDROCHLOROTHIAZIDE 12.5 MG PO CAPS: 12.5000 mg | ORAL_CAPSULE | Freq: Every day | ORAL | Status: DC

## 2013-05-24 NOTE — Progress Notes (Signed)
Subjective:    Patient ID: Yolanda Richard, female    DOB: 1970-03-03, 43 y.o.   MRN: [id number]  HPI 43 year old [place], nonsmoker is in today for recheck of hypertension. She was on lisinopril HCT to cause angioedema and was discontinued and urgent care. She has not had any other medication.  Patient had concerns of anxiety particularly related to work. 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 Reports feeling symptoms of nausea which initially walks to work the last throughout her shift resolved after her shift is complete. She has a family history of anxiety and depression. Has never taken medication.   Review of Systems  Constitutional: Negative.   HENT: Negative.   Respiratory: Negative.   Cardiovascular: Negative.   Genitourinary: Negative.   Musculoskeletal: Negative.   Skin: Negative.   Neurological: Negative.   Psychiatric/Behavioral: Negative.    Past Medical History  Diagnosis Date  . Preterm labor   . Hypertension   . Abscess     Multiple times on perineal area.  . Trichomonas     History   Social History  . Marital Status: Single    Spouse Name: N/A    Number of Children: N/A  . Years of Education: N/A   Occupational History  . Not on file.   Social History Main Topics  . Smoking status: Current Every Day Smoker -- 0.75 packs/day for 16 years    Types: Cigarettes  . Smokeless tobacco: Never Used  . Alcohol Use: Yes     Comment: occasional  . Drug Use: No  . Sexual Activity: Yes    Birth Control/ Protection: Condom, Surgical   Other Topics Concern  . Not on file   Social History Narrative  . No narrative on file    Past Surgical History  Procedure Laterality Date  . Abdominal hysterectomy    . Mouth surgery      Family History  Problem Relation Age of Onset  . Other Neg Hx   . Cancer Mother   . Cancer Maternal Aunt   . Stroke Paternal Uncle   . Diabetes Paternal Uncle   . Cancer Maternal  Grandmother   . Cancer Paternal Grandmother     Allergies  Allergen Reactions  . Ace Inhibitors Swelling    angioedema    Current Outpatient Prescriptions on File Prior to Visit  Medication Sig Dispense Refill  . valACYclovir (VALTREX) 1000 MG tablet Take 1 tablet (1,000 mg total) by mouth 3 (three) times daily.  21 tablet  2   No current facility-administered medications on file prior to visit.    BP 144/98  Pulse 71  Temp(Src) 98.6 F (37 C) (Oral)  Ht 5\' 3"  (1.6 m)  Wt 174 lb (78.926 kg)  BMI 30.83 kg/m2  SpO2 98%chart    Objective:   Physical Exam  Constitutional: She is oriented to person, place, and time. She appears well-developed and well-nourished.  HENT:  Right Ear: External ear normal.  Left Ear: External ear normal.  Nose: Nose normal.  Mouth/Throat: Oropharynx is clear and moist.  Neck: Normal range of motion. Neck supple.  Cardiovascular: Normal rate, regular rhythm and normal heart sounds.   Pulmonary/Chest: Effort normal and breath sounds normal.  Abdominal: Soft. Bowel sounds are normal.  Musculoskeletal: Normal range of motion.  Neurological: She is alert and oriented to person, place, and time.  Skin: Skin is warm and dry.  Psychiatric: She has a normal mood and affect.  Assessment & Plan:  Yolanda Richard was seen today for follow-up.  Diagnoses and associated orders for this visit:  Unspecified essential hypertension  Generalized anxiety disorder  Allergic rhinitis  Other Orders - buPROPion (WELLBUTRIN XL) 150 MG 24 hr tablet; Take 1 tablet (150 mg total) by mouth daily. - hydrochlorothiazide (MICROZIDE) 12.5 MG capsule; Take 1 capsule (12.5 mg total) by mouth daily.    Recommend therapy. Start Wellbutrin 150 mg once daily. Exercise daily. Out of work x3 weeks per patient request

## 2013-05-24 NOTE — Progress Notes (Signed)
Pre visit review using our clinic review tool, if applicable. No additional management support is needed unless otherwise documented below in the visit note. 

## 2013-05-24 NOTE — Patient Instructions (Signed)

## 2013-06-09 ENCOUNTER — Encounter: Payer: Self-pay | Admitting: Family

## 2013-06-28 ENCOUNTER — Ambulatory Visit: Payer: BC Managed Care – PPO | Admitting: Family

## 2013-06-28 DIAGNOSIS — Z0289 Encounter for other administrative examinations: Secondary | ICD-10-CM

## 2013-06-30 ENCOUNTER — Ambulatory Visit: Payer: BC Managed Care – PPO | Admitting: Family

## 2013-06-30 DIAGNOSIS — Z0289 Encounter for other administrative examinations: Secondary | ICD-10-CM

## 2013-10-13 ENCOUNTER — Encounter (HOSPITAL_COMMUNITY): Payer: Self-pay | Admitting: *Deleted

## 2013-10-13 ENCOUNTER — Inpatient Hospital Stay (HOSPITAL_COMMUNITY)
Admission: AD | Admit: 2013-10-13 | Discharge: 2013-10-13 | Discharge status: Against Medical Advice | Payer: BC Managed Care – PPO | Source: Ambulatory Visit | Attending: Obstetrics & Gynecology | Admitting: Obstetrics & Gynecology

## 2013-10-13 DIAGNOSIS — L732 Hidradenitis suppurativa: Secondary | ICD-10-CM | POA: Diagnosis present

## 2013-10-13 DIAGNOSIS — I10 Essential (primary) hypertension: Secondary | ICD-10-CM | POA: Diagnosis not present

## 2013-10-13 DIAGNOSIS — L02239 Carbuncle of trunk, unspecified: Secondary | ICD-10-CM | POA: Diagnosis present

## 2013-10-13 DIAGNOSIS — F17200 Nicotine dependence, unspecified, uncomplicated: Secondary | ICD-10-CM | POA: Insufficient documentation

## 2013-10-13 HISTORY — DX: Hidradenitis suppurativa: L73.2

## 2013-10-13 LAB — CBC WITH DIFFERENTIAL/PLATELET
Basophils Absolute: 0.1 10*3/uL (ref 0.0–0.1)
Basophils Relative: 0 % (ref 0–1)
EOS PCT: 1 % (ref 0–5)
Eosinophils Absolute: 0.2 10*3/uL (ref 0.0–0.7)
HEMATOCRIT: 38.3 % (ref 36.0–46.0)
Hemoglobin: 13.3 g/dL (ref 12.0–15.0)
LYMPHS ABS: 3.2 10*3/uL (ref 0.7–4.0)
LYMPHS PCT: 23 % (ref 12–46)
MCH: 30.2 pg (ref 26.0–34.0)
MCHC: 34.7 g/dL (ref 30.0–36.0)
MCV: 87 fL (ref 78.0–100.0)
Monocytes Absolute: 0.9 10*3/uL (ref 0.1–1.0)
Monocytes Relative: 6 % (ref 3–12)
NEUTROS ABS: 9.9 10*3/uL — AB (ref 1.7–7.7)
Neutrophils Relative %: 70 % (ref 43–77)
PLATELETS: 248 10*3/uL (ref 150–400)
RBC: 4.4 MIL/uL (ref 3.87–5.11)
RDW: 14.3 % (ref 11.5–15.5)
WBC: 14.2 10*3/uL — AB (ref 4.0–10.5)

## 2013-10-13 MED ORDER — DOXYCYCLINE HYCLATE 50 MG PO CAPS: 50.0000 mg | ORAL_CAPSULE | Freq: Two times a day (BID) | ORAL | Status: DC

## 2013-10-13 NOTE — MAU Provider Note (Signed)
Chief Complaint: Recurrent Skin Infections   First Provider Initiated Contact with Patient 10/13/13 2205      SUBJECTIVE HPI: Yolanda Richard is a 43 y.o. [id number] female who presents with boils under her right arm, left groin and labia x1 week. Has history of same. Has been diagnosed with hidradenitis suppurativa. Has not discussed problem with her primary care provider. Was under the care of a dermatologist many years ago, but not recently. Has had several boils drained in the past and has been on antibiotics, but they keep coming back. Denies fever, chills. Boil in left groin draining spontaneously.  Using warm compresses and placing an onion on the boils with minimal improvement.  Hasn't gone to primary care provider because she doesn't have time to go during office hours.  Past Medical History  Diagnosis Date  . Preterm labor   . Hypertension   . Trichomonas   . Hidradenitis suppurativa    OB History  Gravida Para Term Preterm AB SAB TAB Ectopic Multiple Living  # Outcome Date GA Lbr Len/2nd Weight Sex Delivery Anes PTL Lv  5 TAB           4 PRE           3 TRM           2 TRM           1 TRM              Past Surgical History  Procedure Laterality Date  . Abdominal hysterectomy    . Mouth surgery     History   Social History  . Marital Status: Single    Spouse Name: N/A    Number of Children: N/A  . Years of Education: N/A   Occupational History  . Not on file.   Social History Main Topics  . Smoking status: Current Every Day Smoker -- 0.75 packs/day for 16 years    Types: Cigarettes  . Smokeless tobacco: Never Used  . Alcohol Use: Yes     Comment: occasional  . Drug Use: No  . Sexual Activity: Yes    Birth Control/ Protection: Condom, Surgical   Other Topics Concern  . Not on file   Social History Narrative  . No narrative on file   No current facility-administered medications on file prior to encounter.   Current  Outpatient Prescriptions on File Prior to Encounter  Medication Sig Dispense Refill  . buPROPion (WELLBUTRIN XL) 150 MG 24 hr tablet Take 1 tablet (150 mg total) by mouth daily.  30 tablet  3  . hydrochlorothiazide (MICROZIDE) 12.5 MG capsule Take 1 capsule (12.5 mg total) by mouth daily.  30 capsule  3   Allergies  Allergen Reactions  . Ace Inhibitors Swelling    angioedema    ROS: Pertinent items in HPI. Negative for fever, chills. Lesions without erythema. Mild tenderness. Small amount of white drainage from the groin lesion.  OBJECTIVE Blood pressure 148/86, pulse 50, temperature 99.4 F (37.4 C), resp. rate 18, height  (1.626 m), weight 76.114 kg (167 lb 12.8 oz). GENERAL: Well-developed, well-nourished female in no acute distress.  HEENT: Normocephalic HEART: normal rate RESP: normal effort ABDOMEN: Soft, non-tender EXTREMITIES: 4X5 centimeter firm, mildly tender irregular mass under the skin of right axilla. Nonfluctuant. No erythema or drainage. NEURO: Alert and oriented PELVIC EXAM: NEFG except for 1x3 cm draining lesion in right groin and  2X3 centimeter firm, mildly tender, non-fluctuant mass in the upper right labia minora. No erythema or drainage., physiologic discharge, no blood noted.  LAB RESULTS Results for orders placed during the hospital encounter of 10/13/13 (from the past 24 hour(s))  CBC WITH DIFFERENTIAL     Status: Abnormal   Collection Time    10/13/13  9:45 PM      Result Value Ref Range   WBC 14.2 (*) 4.0 - 10.5 K/uL   RBC 4.40  3.87 - 5.11 MIL/uL   Hemoglobin 13.3  12.0 - 15.0 g/dL   HCT 95.2  84.1 - 32.4 %   MCV 87.0  78.0 - 100.0 fL   MCH 30.2  26.0 - 34.0 pg   MCHC 34.7  30.0 - 36.0 g/dL   RDW 40.1  02.7 - 25.3 %   Platelets 248  150 - 400 K/uL   Neutrophils Relative % 70  43 - 77 %   Neutro Abs 9.9 (*) 1.7 - 7.7 K/uL   Lymphocytes Relative 23  12 - 46 %   Lymphs Abs 3.2  0.7 - 4.0 K/uL   Monocytes Relative 6  3 - 12 %   Monocytes  Absolute 0.9  0.1 - 1.0 K/uL   Eosinophils Relative 1  0 - 5 %   Eosinophils Absolute 0.2  0.0 - 0.7 K/uL   Basophils Relative 0  0 - 1 %   Basophils Absolute 0.1  0.0 - 0.1 K/uL    IMAGING No results found.  MAU COURSE Lengthy discussion with patient about management of hidradenitis suppurativa. Incision and drainage are only indicated in situations of severe discomfort and may actually cause worsening of hidradenitis suppurativa overall. None of the lesions are fluctuant today and successful incision and drainage would not be possible. Offered antibiotics per up-to-date recommendations. Patient frustrated because she states she has tried antibiotics before and it did not help. Strongly encouraged patient to return to dermatologist. Informed her that there may be new treatment approaches since her last visit. Patient frustrated that she cannot have I&D done tonight. States she doesn't have time to doctors offices during business hours. Left without discharge instructions or prescription. Prescription electronically prescribed.  ASSESSMENT 1. Hidradenitis suppurativa of right axilla   2. Vulval hidradenitis suppurativa    PLAN Left before discharge. Call primary care provider for dermatology referral. Warm compresses 5 times per day.     Follow-up Information   Follow up with CAMPBELL, PADONDA BOYD, FNP.   Specialty:  Family Medicine   Contact information:   [address] [place] [address] [phone]       Follow up with [facility]. (As needed in emergencies)    Contact information:   [address] [place] [address]        Medication List         buPROPion 150 MG 24 hr tablet  Commonly known as:  WELLBUTRIN XL  Take 1 tablet (150 mg total) by mouth daily.     doxycycline 50 MG capsule  Commonly known as:  VIBRAMYCIN  Take 1 capsule (50 mg total) by mouth 2 (two) times daily.     hydrochlorothiazide 12.5 MG capsule  Commonly known as:   MICROZIDE  Take 1 capsule (12.5 mg total) by mouth daily.     OVER THE COUNTER MEDICATION  Apply 1 application topically as needed. Patient uses prid       [place], [place] 10/14/2013  12:20 AM

## 2013-10-13 NOTE — Discharge Instructions (Signed)
Please get referral to dermatologist from your primary care provider.  Hidradenitis Suppurativa, Sweat Gland Abscess Hidradenitis suppurativa is a long lasting (chronic), uncommon disease of the sweat glands. With this, boil-like lumps and scarring develop in the groin, some times under the arms (axillae), and under the breasts. It may also uncommonly occur behind the ears, in the crease of the buttocks, and around the genitals.  CAUSES  The cause is from a blocking of the sweat glands. They then become infected. It may cause drainage and odor. It is not contagious. So it cannot be given to someone else. It most often shows up in puberty (about 64 to 41 years of age). But it may happen much later. It is similar to acne which is a disease of the sweat glands. This condition is slightly more common in African-Americans and women. SYMPTOMS   Hidradenitis usually starts as one or more red, tender, swellings in the groin or under the arms (axilla).  Over a period of hours to days the lesions get larger. They often open to the skin surface, draining clear to yellow-colored fluid.  The infected area heals with scarring. DIAGNOSIS  Your caregiver makes this diagnosis by looking at you. Sometimes cultures (growing germs on plates in the lab) may be taken. This is to see what germ (bacterium) is causing the infection.  TREATMENT   Topical germ killing medicine applied to the skin (antibiotics) are the treatment of choice. Antibiotics taken by mouth (systemic) are sometimes needed when the condition is getting worse or is severe.  Avoid tight-fitting clothing which traps moisture in.  Dirt does not cause hidradenitis and it is not caused by poor hygiene.  Involved areas should be cleaned daily using an antibacterial soap. Some patients find that the liquid form of Lever 2000, applied to the involved areas as a lotion after bathing, can help reduce the odor related to this condition.  Sometimes surgery  is needed to drain infected areas or remove scarred tissue. Removal of large amounts of tissue is used only in severe cases.  Birth control pills may be helpful.  Oral retinoids (vitamin A derivatives) for 6 to 12 months which are effective for acne may also help this condition.  Weight loss will improve but not cure hidradenitis. It is made worse by being overweight. But the condition is not caused by being overweight.  This condition is more common in people who have had acne.  It may become worse under stress. There is no medical cure for hidradenitis. It can be controlled, but not cured. The condition usually continues for years with periods of getting worse and getting better (remission). Document Released: 09/03/2003 Document Revised: 04/13/2011 Document Reviewed: 04/21/2013 [facility] Patient Information 2015 [place], [place]. This information is not intended to replace advice given to you by your health care provider. Make sure you discuss any questions you have with your health care provider.

## 2013-10-13 NOTE — MAU Note (Signed)
Went back to room with pt's d/c papers and prescription and pt had already left

## 2013-10-13 NOTE — MAU Note (Signed)
I have 3 boils. 2 present for last wk and 3rd came last night. Been trying to self treat but not helping. One on labia, one on L groin, R axilla. Has hx boils

## 2013-10-14 ENCOUNTER — Encounter (HOSPITAL_COMMUNITY): Payer: Self-pay | Admitting: Advanced Practice Midwife

## 2013-10-14 DIAGNOSIS — L732 Hidradenitis suppurativa: Secondary | ICD-10-CM | POA: Diagnosis present

## 2013-10-14 MED ORDER — DOXYCYCLINE HYCLATE 50 MG PO CAPS: 50.0000 mg | ORAL_CAPSULE | Freq: Two times a day (BID) | ORAL | Status: DC

## 2013-10-22 ENCOUNTER — Emergency Department (HOSPITAL_COMMUNITY)
Admission: EM | Admit: 2013-10-22 | Discharge: 2013-10-22 | Disposition: A | Discharge status: Home / Self Care | Payer: BC Managed Care – PPO | Attending: Emergency Medicine | Admitting: Emergency Medicine

## 2013-10-22 ENCOUNTER — Encounter (HOSPITAL_COMMUNITY): Payer: Self-pay | Admitting: Emergency Medicine

## 2013-10-22 DIAGNOSIS — Z8619 Personal history of other infectious and parasitic diseases: Secondary | ICD-10-CM | POA: Diagnosis not present

## 2013-10-22 DIAGNOSIS — IMO0002 Reserved for concepts with insufficient information to code with codable children: Secondary | ICD-10-CM | POA: Diagnosis not present

## 2013-10-22 DIAGNOSIS — Z79899 Other long term (current) drug therapy: Secondary | ICD-10-CM | POA: Insufficient documentation

## 2013-10-22 DIAGNOSIS — L089 Local infection of the skin and subcutaneous tissue, unspecified: Secondary | ICD-10-CM | POA: Insufficient documentation

## 2013-10-22 DIAGNOSIS — I10 Essential (primary) hypertension: Secondary | ICD-10-CM | POA: Diagnosis not present

## 2013-10-22 DIAGNOSIS — Z792 Long term (current) use of antibiotics: Secondary | ICD-10-CM | POA: Insufficient documentation

## 2013-10-22 DIAGNOSIS — L0291 Cutaneous abscess, unspecified: Secondary | ICD-10-CM

## 2013-10-22 DIAGNOSIS — F17200 Nicotine dependence, unspecified, uncomplicated: Secondary | ICD-10-CM | POA: Diagnosis not present

## 2013-10-22 MED ORDER — LIDOCAINE HCL 2 % IJ SOLN
20.0000 mL | Freq: Once | INTRAMUSCULAR | Status: AC
  Administered 2013-10-22: 400 mg
  Filled 2013-10-22: qty 20

## 2013-10-22 MED ORDER — HYDROCODONE-ACETAMINOPHEN 5-325 MG PO TABS
1.0000 | ORAL_TABLET | Freq: Once | ORAL | Status: AC
  Administered 2013-10-22: 1 via ORAL
  Filled 2013-10-22: qty 1

## 2013-10-22 MED ORDER — HYDROCODONE-ACETAMINOPHEN 5-325 MG PO TABS: ORAL_TABLET | ORAL | Status: DC

## 2013-10-22 MED ORDER — FLUCONAZOLE 200 MG PO TABS: 200.0000 mg | ORAL_TABLET | Freq: Once | ORAL | Status: DC

## 2013-10-22 NOTE — ED Provider Notes (Signed)
CSN: [id number]     Arrival date & time 10/22/13  1246 History  This chart was scribed for non-physician practitioner, Wynetta Emery, PA-C working with Juliet Rude. Rubin Payor, MD by Greggory Stallion, ED scribe. This patient was seen in room WTR8/WTR8 and the patient's care was started at 2:08 PM.   Chief Complaint  Patient presents with  . Recurrent Skin Infections   The history is provided by the patient. No language interpreter was used.   HPI Comments: Yolanda Richard is a 43 y.o. female who presents to the Emergency Department complaining of an abscess to her right axilla that started 3 weeks ago. Reports increased pain and swelling over the last week. Pain is worsened with pressure. She has used home remedies such as potatoes and unions with no relief. She has also taken left over amoxicillin with no relief but states it has given her a yeast infection. Denies fever, chills, nausea, emesis. Pt has history of abscesses in the same area. Denies history of diabetes.   Past Medical History  Diagnosis Date  . Preterm labor   . Hypertension   . Trichomonas   . Hidradenitis suppurativa    Past Surgical History  Procedure Laterality Date  . Abdominal hysterectomy    . Mouth surgery     Family History  Problem Relation Age of Onset  . Other Neg Hx   . Cancer Mother   . Cancer Maternal Aunt   . Stroke Paternal Uncle   . Diabetes Paternal Uncle   . Cancer Maternal Grandmother   . Cancer Paternal Grandmother    History  Substance Use Topics  . Smoking status: Current Every Day Smoker -- 0.75 packs/day for 16 years    Types: Cigarettes  . Smokeless tobacco: Never Used  . Alcohol Use: Yes     Comment: occasional   OB History   Grav Para Term Preterm Abortions TAB SAB Ect Mult Living   Review of Systems  Constitutional: Negative for fever and chills.  Gastrointestinal: Negative for nausea and vomiting.  Skin:       Abscess  All other systems reviewed  and are negative.  Allergies  Ace inhibitors  Home Medications   Prior to Admission medications   Medication Sig Start Date End Date Taking? Authorizing Provider  AMOXICILLIN PO Take 1 capsule by mouth once.   Yes Historical Provider, MD  hydrochlorothiazide (MICROZIDE) 12.5 MG capsule Take 12.5 mg by mouth daily.   Yes Historical Provider, MD  ibuprofen (ADVIL,MOTRIN) 800 MG tablet Take 800 mg by mouth every 8 (eight) hours as needed (pain.).   Yes Historical Provider, MD  fluconazole (DIFLUCAN) 200 MG tablet Take 1 tablet (200 mg total) by mouth once. 10/22/13   Jaquin Coy, PA-C  HYDROcodone-acetaminophen (NORCO/VICODIN) 5-325 MG per tablet Take 1-2 tablets by mouth every 6 hours as needed for pain. 10/22/13   Emmelyn Schmale, PA-C   BP 144/96  Pulse 60  Temp(Src) 98.8 F (37.1 C) (Oral)  Resp 16  Ht  (1.6 m)  Wt 167 lb (75.751 kg)  BMI 29.59 kg/m2  SpO2 98%  Physical Exam  Nursing note and vitals reviewed. Constitutional: She is oriented to person, place, and time. She appears well-developed and well-nourished. No distress.  HENT:  Head: Normocephalic.  Eyes: Conjunctivae and EOM are normal.  Cardiovascular: Normal rate.   Pulmonary/Chest: Effort normal. No stridor.  Musculoskeletal: Normal range of motion.  Neurological: She is alert and oriented to person, place, and time.  Skin:  5 x 3 cm abscess to right axilla with no surrounding cellulitis.   Psychiatric: She has a normal mood and affect.    ED Course  Procedures (including critical care time)  INCISION AND DRAINAGE Performed by: Wynetta Emery, PA-C Consent: Verbal consent obtained. Risks and benefits: risks, benefits and alternatives were discussed Type: abscess  Body area: right axilla  Anesthesia: local infiltration  Incision was made with a scalpel.  Local anesthetic: lidocaine 2% without epinephrine  Anesthetic total: 5 ml  Complexity: complex Blunt dissection to break up  loculations  Drainage: purulent  Drainage amount: copious  Packing material: 1/4 in iodoform gauze  Patient tolerance: Patient tolerated the procedure well with no immediate complications.   DIAGNOSTIC STUDIES: Oxygen Saturation is 98% on RA, normal by my interpretation.    COORDINATION OF CARE: 2:10 PM-Discussed treatment plan which includes I&D, an antibiotic and pain medication with pt at bedside and pt agreed to plan.   Labs Review Labs Reviewed - No data to display  Imaging Review No results found.   EKG Interpretation None      MDM   Final diagnoses:  Abscess    Filed Vitals:   10/22/13 1255  BP: 144/96  Pulse: 60  Temp: 98.8 F (37.1 C)  TempSrc: Oral  Resp: 16  Height:  (1.6 m)  Weight: 167 lb (75.751 kg)  SpO2: 98%    Medications  lidocaine (XYLOCAINE) 2 % (with pres) injection 400 mg (400 mg Infiltration Given 10/22/13 1501)  HYDROcodone-acetaminophen (NORCO/VICODIN) 5-325 MG per tablet 1 tablet (1 tablet Oral Given 10/22/13 1500)    Yolanda Richard is a 43 y.o. female presenting with large right axillary abscess, no surrounding cellulitis. No signs of systemic infection. I and D. performed with brush and large amount of purulent material. Abscess is packed and patient is instructed to return to ED in 2 days for packing removal or sooner for signs of systemic infection.  Evaluation does not show pathology that would require ongoing emergent intervention or inpatient treatment. Pt is hemodynamically stable and mentating appropriately. Discussed findings and plan with patient/guardian, who agrees with care plan. All questions answered. Return precautions discussed and outpatient follow up given.   Discharge Medication List as of 10/22/2013  2:52 PM    START taking these medications   Details  HYDROcodone-acetaminophen (NORCO/VICODIN) 5-325 MG per tablet Take 1-2 tablets by mouth every 6 hours as needed for pain., Print           I  personally performed the services described in this documentation, which was scribed in my presence. The recorded information has been reviewed and is accurate.  Wynetta Emery, PA-C 10/22/13 1524

## 2013-10-22 NOTE — ED Notes (Signed)
Patient complains of boil under right axillary that started 3 weeks ago; states pain has increased in past week. Denies fever/chills, N/V.

## 2013-10-22 NOTE — Discharge Instructions (Signed)
Take vicodin for breakthrough pain, do not drink alcohol, drive, care for children or do other critical tasks while taking vicodin. ° °Please follow with your primary care doctor in the next 2 days for a check-up. They must obtain records for further management.  ° °Do not hesitate to return to the Emergency Department for any new, worsening or concerning symptoms.  ° ° °Abscess °Care After °An abscess (also called a boil or furuncle) is an infected area that contains a collection of pus. Signs and symptoms of an abscess include pain, tenderness, redness, or hardness, or you may feel a moveable soft area under your skin. An abscess can occur anywhere in the body. The infection may spread to surrounding tissues causing cellulitis. A cut (incision) by the surgeon was made over your abscess and the pus was drained out. Gauze may have been packed into the space to provide a drain that will allow the cavity to heal from the inside outwards. The boil may be painful for 5 to 7 days. Most people with a boil do not have high fevers. Your abscess, if seen early, may not have localized, and may not have been lanced. If not, another appointment may be required for this if it does not get better on its own or with medications. °HOME CARE INSTRUCTIONS  °· Only take over-the-counter or prescription medicines for pain, discomfort, or fever as directed by your caregiver. °· When you bathe, soak and then remove gauze or iodoform packs at least daily or as directed by your caregiver. You may then wash the wound gently with mild soapy water. Repack with gauze or do as your caregiver directs. °SEEK IMMEDIATE MEDICAL CARE IF:  °· You develop increased pain, swelling, redness, drainage, or bleeding in the wound site. °· You develop signs of generalized infection including muscle aches, chills, fever, or a general ill feeling. °· An oral temperature above 102° F (38.9° C) develops, not controlled by medication. °See your caregiver for a  recheck if you develop any of the symptoms described above. If medications (antibiotics) were prescribed, take them as directed. °Document Released: 08/07/2004 Document Revised: 04/13/2011 Document Reviewed: 04/04/2007 °ExitCare® Patient Information ©2015 [employer]. This information is not intended to replace advice given to you by your health care provider. Make sure you discuss any questions you have with your health care provider. ° °

## 2013-10-24 NOTE — ED Provider Notes (Signed)
Medical screening examination/treatment/procedure(s) were performed by non-physician practitioner and as supervising physician I was immediately available for consultation/collaboration.   EKG Interpretation None       Juliet Rude. Rubin Payor, MD 10/24/13 2351

## 2013-11-03 ENCOUNTER — Ambulatory Visit (INDEPENDENT_AMBULATORY_CARE_PROVIDER_SITE_OTHER): Payer: BC Managed Care – PPO | Admitting: Family

## 2013-11-03 ENCOUNTER — Other Ambulatory Visit: Payer: Self-pay | Admitting: Family

## 2013-11-03 ENCOUNTER — Encounter: Payer: Self-pay | Admitting: Family

## 2013-11-03 VITALS — BP 140/90 | HR 64 | Temp 99.0°F | Wt 166.0 lb

## 2013-11-03 DIAGNOSIS — L02411 Cutaneous abscess of right axilla: Secondary | ICD-10-CM

## 2013-11-03 DIAGNOSIS — L732 Hidradenitis suppurativa: Secondary | ICD-10-CM

## 2013-11-03 MED ORDER — HYDROCODONE-ACETAMINOPHEN 5-325 MG PO TABS: ORAL_TABLET | ORAL | Status: DC

## 2013-11-03 MED ORDER — DOXYCYCLINE HYCLATE 100 MG PO TABS: 100.0000 mg | ORAL_TABLET | Freq: Two times a day (BID) | ORAL | Status: DC

## 2013-11-03 NOTE — Addendum Note (Signed)
Addended by: Bonnye FavaKWEI, Djuana Littleton K on: 11/03/2013 10:56 AM   Modules accepted: Orders

## 2013-11-03 NOTE — Progress Notes (Signed)
Pre visit review using our clinic review tool, if applicable. No additional management support is needed unless otherwise documented below in the visit note. 

## 2013-11-03 NOTE — Patient Instructions (Signed)
Incision and Drainage Incision and drainage is a procedure in which a sac-like structure (cystic structure) is opened and drained. The area to be drained usually contains material such as pus, fluid, or blood.  LET YOUR CAREGIVER KNOW ABOUT:   Allergies to medicine.  Medicines taken, including vitamins, herbs, eyedrops, over-the-counter medicines, and creams.  Use of steroids (by mouth or creams).  Previous problems with anesthetics or numbing medicines.  History of bleeding problems or blood clots.  Previous surgery.  Other health problems, including diabetes and kidney problems.  Possibility of pregnancy, if this applies. RISKS AND COMPLICATIONS  Pain.  Bleeding.  Scarring.  Infection. BEFORE THE PROCEDURE  You may need to have an ultrasound or other imaging tests to see how large or deep your cystic structure is. Blood tests may also be used to determine if you have an infection or how severe the infection is. You may need to have a tetanus shot. PROCEDURE  The affected area is cleaned with a cleaning fluid. The cyst area will then be numbed with a medicine (local anesthetic). A small incision will be made in the cystic structure. A syringe or catheter may be used to drain the contents of the cystic structure, or the contents may be squeezed out. The area will then be flushed with a cleansing solution. After cleansing the area, it is often gently packed with a gauze or another wound dressing. Once it is packed, it will be covered with gauze and tape or some other type of wound dressing. AFTER THE PROCEDURE   Often, you will be allowed to go home right after the procedure.  You may be given antibiotic medicine to prevent or heal an infection.  If the area was packed with gauze or some other wound dressing, you will likely need to come back in 1 to 2 days to get it removed.  The area should heal in about 14 days. Document Released: 07/15/2000 Document Revised: 07/21/2011  Document Reviewed: 03/16/2011 ExitCare Patient Information 2015 [employer]. This information is not intended to replace advice given to you by your health care provider. Make sure you discuss any questions you have with your health care provider.  

## 2013-11-03 NOTE — Progress Notes (Signed)
Subjective:    Patient ID: Yolanda Richard, female    DOB: December 05, 1970, 43 y.o.   MRN: [id number]  HPI 43 year old AAF in today with c/o an abscess under the right arm. She has had multiple recurrence of abscesses under the axilla. Was seen in the ED and had this same lesion I&D on 10/22/13.    Review of Systems  Constitutional: Negative.   Respiratory: Negative.   Cardiovascular: Negative.   Gastrointestinal: Negative.   Endocrine: Negative.   Genitourinary: Negative.   Skin: Negative.        Abscess right axilla.   Allergic/Immunologic: Negative.   Neurological: Negative.   Psychiatric/Behavioral: Negative.    Past Medical History  Diagnosis Date  . Preterm labor   . Hypertension   . Trichomonas   . Hidradenitis suppurativa     History   Social History  . Marital Status: Divorced    Spouse Name: N/A    Number of Children: N/A  . Years of Education: N/A   Occupational History  . Not on file.   Social History Main Topics  . Smoking status: Current Every Day Smoker -- 0.75 packs/day for 16 years    Types: Cigarettes  . Smokeless tobacco: Never Used  . Alcohol Use: Yes     Comment: occasional  . Drug Use: No  . Sexual Activity: Yes    Birth Control/ Protection: Condom, Surgical   Other Topics Concern  . Not on file   Social History Narrative  . No narrative on file    Past Surgical History  Procedure Laterality Date  . Abdominal hysterectomy    . Mouth surgery      Family History  Problem Relation Age of Onset  . Other Neg Hx   . Cancer Mother   . Cancer Maternal Aunt   . Stroke Paternal Uncle   . Diabetes Paternal Uncle   . Cancer Maternal Grandmother   . Cancer Paternal Grandmother     Allergies  Allergen Reactions  . Ace Inhibitors Swelling    angioedema    Current Outpatient Prescriptions on File Prior to Visit  Medication Sig Dispense Refill  . hydrochlorothiazide (MICROZIDE) 12.5 MG capsule Take 12.5 mg by mouth daily.        No current facility-administered medications on file prior to visit.    BP 140/90  Pulse 64  Temp(Src) 99 F (37.2 C) (Oral)  Wt 166 lb (75.297 kg)chart    Objective:   Physical Exam  Constitutional: She is oriented to person, place, and time. She appears well-developed and well-nourished.  Neck: Normal range of motion. Neck supple.  Cardiovascular: Normal rate, regular rhythm and normal heart sounds.   Pulmonary/Chest: Effort normal and breath sounds normal.  Musculoskeletal: Normal range of motion.  Neurological: She is alert and oriented to person, place, and time.  Skin:  Abscess right axilla. Approx 2 cm, tender.   Psychiatric: She has a normal mood and affect.     Informed consent was given by the patient for I&D. The site was prepped with Betadine and using a 11 blade a 1 cm incision was made to the right axilla. Moderate amount of serosanguinous drainage expelled. Wound care was discussed with the patient. Culture obtained.     Assessment & Plan:  [name] was seen today for follow-up.  Diagnoses and associated orders for this visit:  Abscess of right axilla  Hidradenitis suppurativa of right axilla  Other Orders - doxycycline (VIBRA-TABS) 100 MG tablet; Take  1 tablet (100 mg total) by mouth 2 (two) times daily. - HYDROcodone-acetaminophen (NORCO/VICODIN) 5-325 MG per tablet; Take 1-2 tablets by mouth every 6 hours as needed for pain.   Consider surgeon referral if no better or if reoccurs.

## 2013-11-06 LAB — WOUND CULTURE
GRAM STAIN: NONE SEEN
Gram Stain: NONE SEEN

## 2013-12-04 ENCOUNTER — Ambulatory Visit (INDEPENDENT_AMBULATORY_CARE_PROVIDER_SITE_OTHER): Payer: BC Managed Care – PPO | Admitting: Family Medicine

## 2013-12-04 ENCOUNTER — Telehealth: Payer: Self-pay | Admitting: Family

## 2013-12-04 ENCOUNTER — Encounter: Payer: Self-pay | Admitting: Family Medicine

## 2013-12-04 VITALS — BP 136/90 | HR 76 | Temp 98.1°F | Wt 166.0 lb

## 2013-12-04 DIAGNOSIS — L02411 Cutaneous abscess of right axilla: Secondary | ICD-10-CM

## 2013-12-04 NOTE — Telephone Encounter (Signed)
Boil under her rt arm has returned. She is out of work today b/c she cannot put her arm down. Wanted to see you for a work note but there were no openings. Please call.

## 2013-12-04 NOTE — Telephone Encounter (Signed)
See Dr. Caryl NeverBurchette

## 2013-12-04 NOTE — Progress Notes (Signed)
Pre visit review using our clinic review tool, if applicable. No additional management support is needed unless otherwise documented below in the visit note. 

## 2013-12-04 NOTE — Progress Notes (Signed)
   Subjective:    Patient ID: Yolanda Richard, female    DOB: 1970-08-25, 43 y.o.   MRN: [id number]  HPI 43 year old female presents with recurrent abscess in right armpit.  Has been present for 2 months.  Has had it lanced twice before, most recently 2 weeks ago.  She gets these recurring in the armpits and groin area.  Very painful, limiting her from lifting her arm.  States it gets red around the area and "feels like fire inside."  Drains clear pus that has an odor.  Denies fevers.  Was cultured last time that showed growth, but no specific bacteria was found.  Negative for staph and group A strep.  Was put on a 10 day course of doxy and she does not believe that it helped.  Relief was provided when it was lanced last time until it recurred.     Review of Systems  Constitutional: Positive for activity change. Negative for fever, chills, appetite change, fatigue and unexpected weight change.  Respiratory: Negative for cough, chest tightness and shortness of breath.   Cardiovascular: Negative for chest pain.  Gastrointestinal: Negative for nausea and vomiting.  Musculoskeletal: Negative for myalgias, joint swelling and arthralgias.  Skin: Positive for color change and wound (No open wound.  abscess formation in right armpit). Negative for pallor and rash.  Neurological: Negative for dizziness, weakness, light-headedness and numbness.   Past Medical History  Diagnosis Date  . Preterm labor   . Hypertension   . Trichomonas   . Hidradenitis suppurativa     Current Outpatient Prescriptions on File Prior to Visit  Medication Sig Dispense Refill  . hydrochlorothiazide (MICROZIDE) 12.5 MG capsule Take 12.5 mg by mouth daily.     No current facility-administered medications on file prior to visit.       Objective:   Physical Exam  Constitutional: She is oriented to person, place, and time. She appears well-developed and well-nourished. No distress.  Cardiovascular: Normal rate,  regular rhythm and normal heart sounds.   No murmur heard. Pulmonary/Chest: Effort normal and breath sounds normal. No respiratory distress. She has no wheezes.  Musculoskeletal: She exhibits tenderness. She exhibits no edema.  Decreased ROM of right arm due to the pain from abscess.  Neurological: She is alert and oriented to person, place, and time.  Skin: Skin is warm and dry. She is not diaphoretic. There is erythema (around the lateral aspect of the abscess, near her back.).  Large area of fluctuance, non indurated.  Tender to palpation.  No drainage in the office  Nursing note and vitals reviewed.       Assessment & Plan:  1.  Armpit abscess- Refer to general surgeon.  She does not wish for lancing today, for she knows it will recur.  We discussed the possibility of trying topical antibiotics in the future for the other locations.   Luiz OchoaMelissa Abdou Stocks, PA-S  As above.  Right axillary abscess which needs incision and drainage. I think this most likely represents abscess versus hidradenitis supperativa. She is requesting surgical referral since this has been drained twice previously and has recurred. We'll try to get in to see general surgeon tomorrow. We offered I&D today but she prefers above.  Bruce Burchette M.D.

## 2013-12-04 NOTE — Telephone Encounter (Signed)
Please advise 

## 2013-12-06 ENCOUNTER — Other Ambulatory Visit (INDEPENDENT_AMBULATORY_CARE_PROVIDER_SITE_OTHER): Payer: Self-pay | Admitting: General Surgery

## 2013-12-06 ENCOUNTER — Inpatient Hospital Stay (HOSPITAL_COMMUNITY)
Admission: AD | Admit: 2013-12-06 | Discharge: 2013-12-09 | DRG: 603 | Disposition: A | Discharge status: Home Health | Payer: BC Managed Care – PPO | Source: Ambulatory Visit | Attending: Surgery | Admitting: Surgery

## 2013-12-06 ENCOUNTER — Encounter (HOSPITAL_COMMUNITY): Payer: Self-pay | Admitting: Surgery

## 2013-12-06 DIAGNOSIS — L02411 Cutaneous abscess of right axilla: Principal | ICD-10-CM | POA: Diagnosis present

## 2013-12-06 DIAGNOSIS — B964 Proteus (mirabilis) (morganii) as the cause of diseases classified elsewhere: Secondary | ICD-10-CM | POA: Diagnosis present

## 2013-12-06 DIAGNOSIS — L732 Hidradenitis suppurativa: Secondary | ICD-10-CM | POA: Diagnosis present

## 2013-12-06 DIAGNOSIS — E780 Pure hypercholesterolemia: Secondary | ICD-10-CM | POA: Diagnosis present

## 2013-12-06 DIAGNOSIS — L02419 Cutaneous abscess of limb, unspecified: Secondary | ICD-10-CM | POA: Diagnosis present

## 2013-12-06 DIAGNOSIS — F329 Major depressive disorder, single episode, unspecified: Secondary | ICD-10-CM | POA: Diagnosis present

## 2013-12-06 LAB — BASIC METABOLIC PANEL
Anion gap: 12 (ref 5–15)
BUN: 8 mg/dL (ref 6–23)
CALCIUM: 9.5 mg/dL (ref 8.4–10.5)
CO2: 22 mEq/L (ref 19–32)
Chloride: 102 mEq/L (ref 96–112)
Creatinine, Ser: 0.68 mg/dL (ref 0.50–1.10)
GFR calc Af Amer: >90 mL/min (ref >90)
GFR calc non Af Amer: >90 mL/min (ref >90)
GLUCOSE: 118 mg/dL — AB (ref 70–99)
Potassium: 3.8 mEq/L (ref 3.7–5.3)
Sodium: 136 mEq/L — ABNORMAL LOW (ref 137–147)

## 2013-12-06 LAB — CBC
HCT: 37.4 % (ref 36.0–46.0)
HEMOGLOBIN: 13.2 g/dL (ref 12.0–15.0)
MCH: 29.9 pg (ref 26.0–34.0)
MCHC: 35.3 g/dL (ref 30.0–36.0)
MCV: 84.8 fL (ref 78.0–100.0)
Platelets: 279 10*3/uL (ref 150–400)
RBC: 4.41 MIL/uL (ref 3.87–5.11)
RDW: 13.8 % (ref 11.5–15.5)
WBC: 15.9 10*3/uL — ABNORMAL HIGH (ref 4.0–10.5)

## 2013-12-06 LAB — SURGICAL PCR SCREEN
MRSA, PCR: NEGATIVE
Staphylococcus aureus: NEGATIVE

## 2013-12-06 MED ORDER — KCL IN DEXTROSE-NACL 20-5-0.45 MEQ/L-%-% IV SOLN
INTRAVENOUS | Status: DC
Start: 2013-12-06 — End: 2013-12-09
  Administered 2013-12-06: 50 mL via INTRAVENOUS
  Administered 2013-12-07 – 2013-12-08 (×2): via INTRAVENOUS
  Filled 2013-12-06 (×5): qty 1000

## 2013-12-06 MED ORDER — ONDANSETRON HCL 4 MG/2ML IJ SOLN: 4.0000 mg | Freq: Four times a day (QID) | INTRAMUSCULAR | Status: DC | PRN

## 2013-12-06 MED ORDER — SODIUM CHLORIDE 0.9 % IV SOLN
3.0000 g | Freq: Four times a day (QID) | INTRAVENOUS | Status: DC
  Administered 2013-12-07 – 2013-12-09 (×9): 3 g via INTRAVENOUS
  Filled 2013-12-06 (×11): qty 3

## 2013-12-06 MED ORDER — MORPHINE SULFATE 2 MG/ML IJ SOLN
2.0000 mg | INTRAMUSCULAR | Status: DC | PRN
  Administered 2013-12-06 – 2013-12-07 (×3): 4 mg via INTRAVENOUS
  Administered 2013-12-07: 2 mg via INTRAVENOUS
  Administered 2013-12-07 – 2013-12-08 (×5): 4 mg via INTRAVENOUS
  Filled 2013-12-06 (×10): qty 2

## 2013-12-06 MED ORDER — OXYCODONE HCL 5 MG PO TABS
5.0000 mg | ORAL_TABLET | ORAL | Status: DC | PRN
  Administered 2013-12-06 – 2013-12-08 (×2): 5 mg via ORAL
  Filled 2013-12-06 (×2): qty 1

## 2013-12-06 MED ORDER — ENOXAPARIN SODIUM 40 MG/0.4ML ~~LOC~~ SOLN
40.0000 mg | SUBCUTANEOUS | Status: DC
  Administered 2013-12-07 – 2013-12-08 (×2): 40 mg via SUBCUTANEOUS
  Filled 2013-12-06 (×3): qty 0.4

## 2013-12-06 MED ORDER — ACETAMINOPHEN 650 MG RE SUPP: 650.0000 mg | Freq: Four times a day (QID) | RECTAL | Status: DC | PRN

## 2013-12-06 MED ORDER — SODIUM CHLORIDE 0.9 % IV SOLN
3.0000 g | Freq: Once | INTRAVENOUS | Status: AC
  Administered 2013-12-06: 3 g via INTRAVENOUS
  Filled 2013-12-06: qty 3

## 2013-12-06 MED ORDER — DOCUSATE SODIUM 100 MG PO CAPS
100.0000 mg | ORAL_CAPSULE | Freq: Two times a day (BID) | ORAL | Status: DC
  Administered 2013-12-06 – 2013-12-08 (×5): 100 mg via ORAL
  Filled 2013-12-06 (×7): qty 1

## 2013-12-06 MED ORDER — ACETAMINOPHEN 325 MG PO TABS
650.0000 mg | ORAL_TABLET | Freq: Four times a day (QID) | ORAL | Status: DC | PRN
  Administered 2013-12-06: 650 mg via ORAL
  Filled 2013-12-06: qty 2

## 2013-12-06 NOTE — H&P (Signed)
History of Present Illness Romie Levee(Tatsuo Musial MD; 12/06/2013 3:41 PM) Patient words: recurrent abscess under armpit.  The patient is a 43 year old female who presents with a subcutaneous abscess. Abscess location is the right axilla. The patient describes the pain as sharp and aching. Onset was 2 month(s) ago. Pt states she has had this abscess drained twice, and it came back. She reports severe pain but denies fevers. 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 She has completed a 10 day course of Doxycicline.  Other Problems Kerrie Buffalo(Michele Daniels, CMA; 12/06/2013 3:13 PM) Asthma Back Pain Depression High blood pressure Hypercholesterolemia  Past Surgical History Kerrie Buffalo(Michele Daniels, CMA; 12/06/2013 3:13 PM) Hysterectomy (not due to cancer) - Partial  Diagnostic Studies History Kerrie Buffalo(Michele Daniels, CMA; 12/06/2013 3:13 PM) Colonoscopy never Mammogram within last year Pap Smear 1-5 years ago  Allergies Kerrie Buffalo(Michele Daniels, CMA; 12/06/2013 3:20 PM) No Known Drug Allergies 12/06/2013  Medication History Kerrie Buffalo(Michele Daniels, CMA; 12/06/2013 3:21 PM) BuPROPion HCl ER (XL) (150MG  Tablet ER 24HR, Oral) Active. Hydrochlorothiazide (12.5MG  Capsule, Oral) Active.  Social History Kerrie Buffalo(Michele Daniels, [place]; 12/06/2013 3:13 PM) Alcohol use Occasional alcohol use. Caffeine use Carbonated beverages, Tea. No drug use Tobacco use Current every day smoker.  Family History Kerrie Buffalo(Michele Daniels, CMA; 12/06/2013 3:13 PM) Breast Cancer Mother. Diabetes Mellitus Mother. Heart disease in female family member before age 41 Hypertension Daughter, Mother.  Pregnancy / Birth History Kerrie Buffalo(Michele Daniels, CMA; 12/06/2013 3:13 PM) Age at menarche 14 years. Gravida 5 Irregular periods Maternal age 41-20 Para 4     Review of Systems Kerrie Buffalo(Michele Daniels CMA; 12/06/2013 3:13 PM) General Present- Appetite Loss, Chills and Weight Loss. Not  Present- Fatigue, Fever, Night Sweats and Weight Gain. Skin Present- Non-Healing Wounds. Not Present- Change in Wart/Mole, Dryness, Hives, Jaundice, New Lesions, Rash and Ulcer. HEENT Present- Wears glasses/contact lenses. Not Present- Earache, Hearing Loss, Hoarseness, Nose Bleed, Oral Ulcers, Ringing in the Ears, Seasonal Allergies, Sinus Pain, Sore Throat, Visual Disturbances and Yellow Eyes. Respiratory Not Present- Bloody sputum, Chronic Cough, Difficulty Breathing, Snoring and Wheezing. Breast Present- Breast Pain. Not Present- Breast Mass, Nipple Discharge and Skin Changes. Cardiovascular Not Present- Chest Pain, Difficulty Breathing Lying Down, Leg Cramps, Palpitations, Rapid Heart Rate, Shortness of Breath and Swelling of Extremities. Gastrointestinal Present- Change in Bowel Habits. Not Present- Abdominal Pain, Bloating, Bloody Stool, Chronic diarrhea, Constipation, Difficulty Swallowing, Excessive gas, Gets full quickly at meals, Hemorrhoids, Indigestion, Nausea, Rectal Pain and Vomiting. Female Genitourinary Not Present- Frequency, Nocturia, Painful Urination, Pelvic Pain and Urgency. Musculoskeletal Present- Back Pain, Joint Stiffness and Muscle Pain. Not Present- Joint Pain, Muscle Weakness and Swelling of Extremities. Neurological Present- Tingling and Weakness. Not Present- Decreased Memory, Fainting, Headaches, Numbness, Seizures, Tremor and Trouble walking. Psychiatric Present- Anxiety, Depression and Frequent crying. Not Present- Bipolar, Change in Sleep Pattern and Fearful. Endocrine Not Present- Cold Intolerance, Excessive Hunger, Hair Changes, Heat Intolerance, Hot flashes and New Diabetes. Hematology Not Present- Easy Bruising, Excessive bleeding, Gland problems, HIV and Persistent Infections.  Vitals Kerrie Buffalo(Michele Daniels CMA; 12/06/2013 3:19 PM) 12/06/2013 3:14 PM Weight: 165.5 lb Height: 63in Body Surface Area: 1.83 m Body Mass Index: 29.32 kg/m Temp.: 99.53F  Pulse:  72 (Regular)  BP: 160/100 (Sitting, Left Arm, Standard)     Physical Exam Romie Levee(Hakop Humbarger MD; 12/06/2013 3:43 PM)  General Mental Status-Alert. General Appearance-Not in acute distress, Not Sickly. Orientation-Oriented X3. Hydration-Well hydrated. Voice-Normal.  Integumentary Global Assessment Upon inspection and palpation of  skin surfaces of the - Axillae: non-tender, no inflammation or ulceration, no drainage. and Distribution of scalp and body hair is normal. General Characteristics Temperature - normal warmth is noted. Problem #1 Location - Right Upper Extremity - Note: axilla with large fluctuant mass, tender to palpation, no drainage.  Head and Neck Head-normocephalic, atraumatic with no lesions or palpable masses. Face Global Assessment - atraumatic, no absence of expression. Neck Global Assessment - no abnormal movements, no bruit auscultated on the right, no bruit auscultated on the left, no decreased range of motion, non-tender. Trachea-midline. Thyroid Gland Characteristics - non-tender.  Chest and Lung Exam Inspection Movements - Normal and Symmetrical. Accessory muscles - No use of accessory muscles in breathing. Palpation Palpation of the chest reveals - Non-tender. Auscultation Breath sounds - Normal and Clear.  Cardiovascular Auscultation Rhythm - Regular. Murmurs & Other Heart Sounds - Auscultation of the heart reveals - No Murmurs and No Systolic Clicks.  Abdomen Inspection Inspection of the abdomen reveals - No Visible peristalsis and No Abnormal pulsations. Umbilicus - No Bleeding, No Urine drainage. Palpation/Percussion Palpation and Percussion of the abdomen reveal - Soft, Non Tender, No Rebound tenderness, No Rigidity (guarding) and No Cutaneous hyperesthesia.  Peripheral Vascular Upper Extremity Inspection - Left - No Cyanotic nailbeds, Not Ischemic. Right - No Cyanotic nailbeds, Not Ischemic.  Neurologic Neurologic  evaluation reveals -normal attention span and ability to concentrate, able to name objects and repeat phrases. Appropriate fund of knowledge , normal sensation and normal coordination. Mental Status Affect - not angry, not paranoid. Cranial Nerves-Normal Bilaterally. Gait-Normal.  Neuropsychiatric Mental status exam performed with findings of-able to articulate well with normal speech/language, rate, volume and coherence, thought content normal with ability to perform basic computations and apply abstract reasoning and no evidence of hallucinations, delusions, obsessions or homicidal/suicidal ideation.  Musculoskeletal Global Assessment Spine, Ribs and Pelvis - no instability, subluxation or laxity. Right Upper Extremity - no instability, subluxation or laxity.    Assessment & Plan Romie Levee(Betsie Peckman MD; 12/06/2013 3:45 PM)  ABSCESS OF AXILLA, RIGHT (682.3  L02.411) Story: 43yo F with recurrent axillary abscess Impression: She has a large abscess. She is refusing I&D in the office. She would like to be admitted overnight for surgery tomorrow. We will place her on IV antibiotics.

## 2013-12-06 NOTE — H&P (Deleted)
  The note originally documented on this encounter has been moved the the encounter in which it belongs.  

## 2013-12-07 ENCOUNTER — Observation Stay (HOSPITAL_COMMUNITY): Payer: BC Managed Care – PPO | Admitting: Anesthesiology

## 2013-12-07 ENCOUNTER — Encounter (HOSPITAL_COMMUNITY): Payer: Self-pay

## 2013-12-07 ENCOUNTER — Encounter (HOSPITAL_COMMUNITY): Admission: AD | Disposition: A | Discharge status: Home Health | Payer: Self-pay | Source: Ambulatory Visit

## 2013-12-07 DIAGNOSIS — B964 Proteus (mirabilis) (morganii) as the cause of diseases classified elsewhere: Secondary | ICD-10-CM | POA: Diagnosis present

## 2013-12-07 DIAGNOSIS — L02411 Cutaneous abscess of right axilla: Secondary | ICD-10-CM | POA: Diagnosis present

## 2013-12-07 DIAGNOSIS — F329 Major depressive disorder, single episode, unspecified: Secondary | ICD-10-CM | POA: Diagnosis present

## 2013-12-07 DIAGNOSIS — L732 Hidradenitis suppurativa: Secondary | ICD-10-CM | POA: Diagnosis present

## 2013-12-07 DIAGNOSIS — E780 Pure hypercholesterolemia: Secondary | ICD-10-CM | POA: Diagnosis present

## 2013-12-07 DIAGNOSIS — M79621 Pain in right upper arm: Secondary | ICD-10-CM | POA: Diagnosis present

## 2013-12-07 HISTORY — PX: HYDRADENITIS EXCISION: SHX5243

## 2013-12-07 SURGERY — EXCISION, HIDRADENITIS, AXILLA
Anesthesia: General | Site: Axilla | Laterality: Right

## 2013-12-07 MED ORDER — PROMETHAZINE HCL 25 MG/ML IJ SOLN: 6.2500 mg | INTRAMUSCULAR | Status: DC | PRN

## 2013-12-07 MED ORDER — SODIUM CHLORIDE 0.9 % IJ SOLN
INTRAMUSCULAR | Status: AC
  Filled 2013-12-07: qty 3

## 2013-12-07 MED ORDER — NEOSTIGMINE METHYLSULFATE 10 MG/10ML IV SOLN
INTRAVENOUS | Status: AC
  Filled 2013-12-07: qty 1

## 2013-12-07 MED ORDER — OXYCODONE HCL 5 MG PO TABS: 5.0000 mg | ORAL_TABLET | Freq: Once | ORAL | Status: AC | PRN

## 2013-12-07 MED ORDER — METOPROLOL TARTRATE 1 MG/ML IV SOLN
INTRAVENOUS | Status: DC | PRN
  Administered 2013-12-07: 1 mg via INTRAVENOUS
  Administered 2013-12-07: 2 mg via INTRAVENOUS

## 2013-12-07 MED ORDER — METOCLOPRAMIDE HCL 5 MG/ML IJ SOLN
INTRAMUSCULAR | Status: DC | PRN
  Administered 2013-12-07: 10 mg via INTRAVENOUS

## 2013-12-07 MED ORDER — ROCURONIUM BROMIDE 100 MG/10ML IV SOLN
INTRAVENOUS | Status: DC | PRN
  Administered 2013-12-07: 30 mg via INTRAVENOUS

## 2013-12-07 MED ORDER — METOCLOPRAMIDE HCL 5 MG/ML IJ SOLN
INTRAMUSCULAR | Status: AC
  Filled 2013-12-07: qty 2

## 2013-12-07 MED ORDER — FENTANYL CITRATE 0.05 MG/ML IJ SOLN
INTRAMUSCULAR | Status: DC | PRN
  Administered 2013-12-07 (×2): 50 ug via INTRAVENOUS
  Administered 2013-12-07: 100 ug via INTRAVENOUS

## 2013-12-07 MED ORDER — HYDROCHLOROTHIAZIDE 12.5 MG PO CAPS
12.5000 mg | ORAL_CAPSULE | Freq: Every day | ORAL | Status: DC
  Administered 2013-12-07 – 2013-12-08 (×2): 12.5 mg via ORAL
  Filled 2013-12-07 (×4): qty 1

## 2013-12-07 MED ORDER — NICOTINE 21 MG/24HR TD PT24
21.0000 mg | MEDICATED_PATCH | Freq: Every day | TRANSDERMAL | Status: DC
  Administered 2013-12-07 – 2013-12-08 (×2): 21 mg via TRANSDERMAL
  Filled 2013-12-07 (×4): qty 1

## 2013-12-07 MED ORDER — MIDAZOLAM HCL 2 MG/2ML IJ SOLN
INTRAMUSCULAR | Status: AC
  Filled 2013-12-07: qty 2

## 2013-12-07 MED ORDER — MIDAZOLAM HCL 5 MG/5ML IJ SOLN
INTRAMUSCULAR | Status: DC | PRN
  Administered 2013-12-07: 2 mg via INTRAVENOUS

## 2013-12-07 MED ORDER — ROCURONIUM BROMIDE 100 MG/10ML IV SOLN
INTRAVENOUS | Status: AC
  Filled 2013-12-07: qty 1

## 2013-12-07 MED ORDER — NEOSTIGMINE METHYLSULFATE 10 MG/10ML IV SOLN
INTRAVENOUS | Status: DC | PRN
  Administered 2013-12-07: 5 mg via INTRAVENOUS

## 2013-12-07 MED ORDER — HYDROMORPHONE HCL 1 MG/ML IJ SOLN
0.2500 mg | INTRAMUSCULAR | Status: DC | PRN
  Administered 2013-12-07 (×2): 0.5 mg via INTRAVENOUS

## 2013-12-07 MED ORDER — ONDANSETRON HCL 4 MG/2ML IJ SOLN
INTRAMUSCULAR | Status: AC
  Filled 2013-12-07: qty 2

## 2013-12-07 MED ORDER — GLYCOPYRROLATE 0.2 MG/ML IJ SOLN
INTRAMUSCULAR | Status: DC | PRN
Start: 2013-12-07 — End: 2013-12-07
  Administered 2013-12-07: 0.6 mg via INTRAVENOUS

## 2013-12-07 MED ORDER — LACTATED RINGERS IV SOLN
INTRAVENOUS | Status: DC
  Administered 2013-12-07: 1000 mL via INTRAVENOUS

## 2013-12-07 MED ORDER — PROPOFOL 10 MG/ML IV BOLUS
INTRAVENOUS | Status: AC
  Filled 2013-12-07: qty 20

## 2013-12-07 MED ORDER — DEXAMETHASONE SODIUM PHOSPHATE 10 MG/ML IJ SOLN
INTRAMUSCULAR | Status: AC
Start: 2013-12-07 — End: 2013-12-07
  Filled 2013-12-07: qty 1

## 2013-12-07 MED ORDER — PROPOFOL 10 MG/ML IV BOLUS
INTRAVENOUS | Status: DC | PRN
  Administered 2013-12-07: 150 mg via INTRAVENOUS

## 2013-12-07 MED ORDER — GLYCOPYRROLATE 0.2 MG/ML IJ SOLN
INTRAMUSCULAR | Status: AC
  Filled 2013-12-07: qty 4

## 2013-12-07 MED ORDER — FENTANYL CITRATE 0.05 MG/ML IJ SOLN
INTRAMUSCULAR | Status: AC
  Filled 2013-12-07: qty 5

## 2013-12-07 MED ORDER — HYDROMORPHONE HCL 1 MG/ML IJ SOLN
INTRAMUSCULAR | Status: AC
  Filled 2013-12-07: qty 1

## 2013-12-07 MED ORDER — MEPERIDINE HCL 50 MG/ML IJ SOLN: 6.2500 mg | INTRAMUSCULAR | Status: DC | PRN

## 2013-12-07 MED ORDER — OXYCODONE HCL 5 MG/5ML PO SOLN: 5.0000 mg | Freq: Once | ORAL | Status: AC | PRN

## 2013-12-07 SURGICAL SUPPLY — 40 items
APL SKNCLS STERI-STRIP NONHPOA (GAUZE/BANDAGES/DRESSINGS)
BANDAGE ELASTIC 6 VELCRO ST LF (GAUZE/BANDAGES/DRESSINGS) IMPLANT
BENZOIN TINCTURE PRP APPL 2/3 (GAUZE/BANDAGES/DRESSINGS) IMPLANT
BLADE HEX COATED 2.75 (ELECTRODE) ×3 IMPLANT
BLADE SURG 15 STRL LF DISP TIS (BLADE) ×1 IMPLANT
BLADE SURG 15 STRL SS (BLADE) ×3
BLADE SURG SZ10 CARB STEEL (BLADE) IMPLANT
CANISTER SUCTION 2500CC (MISCELLANEOUS) ×3 IMPLANT
CLOSURE WOUND 1/2 X4 (GAUZE/BANDAGES/DRESSINGS)
COVER SURGICAL LIGHT HANDLE (MISCELLANEOUS) ×3 IMPLANT
DECANTER SPIKE VIAL GLASS SM (MISCELLANEOUS) ×3 IMPLANT
DRAPE LAPAROSCOPIC ABDOMINAL (DRAPES) ×3 IMPLANT
DRSG TEGADERM 4X4.75 (GAUZE/BANDAGES/DRESSINGS) IMPLANT
ELECT REM PT RETURN 9FT ADLT (ELECTROSURGICAL) ×3
ELECTRODE REM PT RTRN 9FT ADLT (ELECTROSURGICAL) ×1 IMPLANT
GAUZE IODOFORM PACK 1/2 7832 (GAUZE/BANDAGES/DRESSINGS) ×2 IMPLANT
GAUZE SPONGE 4X4 12PLY STRL (GAUZE/BANDAGES/DRESSINGS) ×2 IMPLANT
GAUZE SPONGE 4X4 16PLY XRAY LF (GAUZE/BANDAGES/DRESSINGS) IMPLANT
GLOVE BIO SURGEON STRL SZ7 (GLOVE) ×3 IMPLANT
GLOVE BIOGEL PI IND STRL 7.0 (GLOVE) ×1 IMPLANT
GLOVE BIOGEL PI IND STRL 7.5 (GLOVE) ×1 IMPLANT
GLOVE BIOGEL PI INDICATOR 7.0 (GLOVE) ×2
GLOVE BIOGEL PI INDICATOR 7.5 (GLOVE) ×2
GOWN STRL REUS W/ TWL XL LVL3 (GOWN DISPOSABLE) ×1 IMPLANT
GOWN STRL REUS W/TWL LRG LVL3 (GOWN DISPOSABLE) ×6 IMPLANT
GOWN STRL REUS W/TWL XL LVL3 (GOWN DISPOSABLE) ×6 IMPLANT
KIT BASIN OR (CUSTOM PROCEDURE TRAY) ×3 IMPLANT
MARKER SKIN DUAL TIP RULER LAB (MISCELLANEOUS) IMPLANT
NDL HYPO 25X1 1.5 SAFETY (NEEDLE) IMPLANT
NEEDLE HYPO 22GX1.5 SAFETY (NEEDLE) IMPLANT
NEEDLE HYPO 25X1 1.5 SAFETY (NEEDLE) IMPLANT
PACK GENERAL/GYN (CUSTOM PROCEDURE TRAY) ×3 IMPLANT
STRIP CLOSURE SKIN 1/2X4 (GAUZE/BANDAGES/DRESSINGS) IMPLANT
SUT ETHILON 2 0 PS N (SUTURE) IMPLANT
SUT VIC AB 2-0 SH 18 (SUTURE) IMPLANT
SUT VIC AB 3-0 SH 27 (SUTURE)
SUT VIC AB 3-0 SH 27XBRD (SUTURE) IMPLANT
SYR CONTROL 10ML LL (SYRINGE) IMPLANT
TAPE CLOTH SURG 4X10 WHT LF (GAUZE/BANDAGES/DRESSINGS) ×2 IMPLANT
TOWEL OR 17X26 10 PK STRL BLUE (TOWEL DISPOSABLE) ×3 IMPLANT

## 2013-12-07 NOTE — Op Note (Signed)
Preoperative diagnosis: right axillary abscess Postoperative diagnosis: same as above Procedure: Incision and drainage of right axillary abscess Surgeon: Dr. Harden MoMatt Mateen Franssen Anesthesia: Gen. Estimated blood loss: Minimal Complications: None Drains: None Specimens: Cultures to microbiology Sponge and needle count was correct at completion Disposition to recovery in stable condition  Indications: This is a 43 year old female who was seen in our urgent office yesterday with a 2 month history of a right axillary abscess. This has been drained a couple of times in the office area and she comes in with a recurrence of this. She is unable to have this drained under local anesthetic and she was admitted to the hospital for drainage under anesthesia. We discussed drainage as well as an open wound postoperatively.  Procedure: After informed consent was obtained the patient was taken to the operating room. She was on antibiotics. Sequential compression devices were on her legs. She was placed under general anesthesia without complication. Her right axilla was then prepped and draped in the standard sterile surgical fashion. A surgical timeout was then performed.  I made an elliptical incision in the right axilla. I removed a portion of skin. There was a large amount of purulence present. Cultures were taken. I then evacuated this completely. There were some loculations that were broken up with my finger. I then irrigated this. This was packed with iodoform and a dressing was placed. She tolerated this well was transferred to recovery stable.

## 2013-12-07 NOTE — Anesthesia Postprocedure Evaluation (Signed)
Anesthesia Post Note  Patient: Yolanda PotterShaunita Richard  Procedure(s) Performed: Procedure(s) (LRB): EXCISION HIDRADENITIS AXILLA right axilla abscess  (Right)  Anesthesia type: General  Patient location: PACU  Post pain: Pain level controlled  Post assessment: Post-op Vital signs reviewed  Last Vitals: BP 143/89 mmHg  Pulse 65  Temp(Src) 36.8 C (Oral)  Resp 16  Ht 5\' 3"  (1.6 m)  Wt 167 lb 7.2 oz (75.956 kg)  BMI 29.67 kg/m2  SpO2 93%  Post vital signs: Reviewed  Level of consciousness: sedated  Complications: No apparent anesthesia complications

## 2013-12-07 NOTE — Anesthesia Preprocedure Evaluation (Signed)
Anesthesia Evaluation  Patient identified by MRN, date of birth, ID band Patient awake    Reviewed: Allergy & Precautions, H&P , NPO status , Patient's Chart, lab work & pertinent test results  Airway Mallampati: II  TM Distance: >3 FB Neck ROM: Full    Dental no notable dental hx.    Pulmonary asthma , Current Smoker,  breath sounds clear to auscultation  Pulmonary exam normal       Cardiovascular hypertension, negative cardio ROS  Rhythm:Regular Rate:Normal     Neuro/Psych  Headaches, PSYCHIATRIC DISORDERS    GI/Hepatic negative GI ROS, Neg liver ROS,   Endo/Other  negative endocrine ROS  Renal/GU negative Renal ROS     Musculoskeletal negative musculoskeletal ROS (+)   Abdominal   Peds  Hematology  (+) anemia ,   Anesthesia Other Findings   Reproductive/Obstetrics negative OB ROS                             Anesthesia Physical Anesthesia Plan  ASA: II  Anesthesia Plan: General   Post-op Pain Management:    Induction: Intravenous  Airway Management Planned: Oral ETT  Additional Equipment:   Intra-op Plan:   Post-operative Plan: Extubation in OR  Informed Consent: I have reviewed the patients History and Physical, chart, labs and discussed the procedure including the risks, benefits and alternatives for the proposed anesthesia with the patient or authorized representative who has indicated his/her understanding and acceptance.   Dental advisory given  Plan Discussed with: CRNA  Anesthesia Plan Comments:         Anesthesia Quick Evaluation

## 2013-12-07 NOTE — Plan of Care (Signed)
Problem: Phase I Progression Outcomes Goal: Voiding-avoid urinary catheter unless indicated Outcome: Completed/Met Date Met:  12/07/13     

## 2013-12-07 NOTE — Transfer of Care (Signed)
Immediate Anesthesia Transfer of Care Note  Patient: Yolanda Richard  Procedure(s) Performed: Procedure(s): EXCISION HIDRADENITIS AXILLA right axilla abscess  (Right)  Patient Location: PACU  Anesthesia Type:General  Level of Consciousness: Patient easily awoken, sedated, comfortable, cooperative, following commands, responds to stimulation.   Airway & Oxygen Therapy: Patient spontaneously breathing, ventilating well, oxygen via simple oxygen mask.  Post-op Assessment: Report given to PACU RN, vital signs reviewed and stable, moving all extremities.   Post vital signs: Reviewed and stable.  Complications: No apparent anesthesia complications

## 2013-12-08 ENCOUNTER — Encounter (HOSPITAL_COMMUNITY): Payer: Self-pay | Admitting: General Surgery

## 2013-12-08 MED ORDER — OXYCODONE HCL 5 MG PO TABS
5.0000 mg | ORAL_TABLET | ORAL | Status: DC | PRN
  Administered 2013-12-08 – 2013-12-09 (×3): 10 mg via ORAL
  Filled 2013-12-08 (×3): qty 2

## 2013-12-08 MED ORDER — ZOLPIDEM TARTRATE 5 MG PO TABS
5.0000 mg | ORAL_TABLET | Freq: Once | ORAL | Status: AC
  Administered 2013-12-08: 5 mg via ORAL
  Filled 2013-12-08: qty 1

## 2013-12-08 NOTE — Plan of Care (Signed)
Problem: Phase I Progression Outcomes Goal: Pain controlled with appropriate interventions Outcome: Completed/Met Date Met:  12/08/13

## 2013-12-08 NOTE — Plan of Care (Signed)
Problem: Phase II Progression Outcomes Goal: Pain controlled Outcome: Completed/Met Date Met:  12/08/13 Goal: Progress activity as tolerated unless otherwise ordered Outcome: Completed/Met Date Met:  12/08/13 Goal: Progressing with IS, TCDB Outcome: Completed/Met Date Met:  12/08/13 Goal: Vital signs stable Outcome: Completed/Met Date Met:  12/08/13 Goal: Surgical site without signs of infection Outcome: Completed/Met Date Met:  12/08/13 Goal: Dressings dry/intact Outcome: Adequate for Discharge Goal: Foley discontinued Outcome: Completed/Met Date Met:  12/08/13  Problem: Phase III Progression Outcomes Goal: Pain controlled on oral analgesia Outcome: Progressing Goal: Activity at appropriate level-compared to baseline (UP IN CHAIR FOR HEMODIALYSIS)  Outcome: Completed/Met Date Met:  12/08/13 Goal: Voiding independently Outcome: Completed/Met Date Met:  12/08/13     

## 2013-12-08 NOTE — Progress Notes (Signed)
Patient ID: Yolanda WilliamsonMarice Richard, female   DOB: 12/31/1970, 43 y.o.   MRN: [id number] 1 Day Post-Op  Subjective: Pt has some pain this am, but well controlled.  Objective: Vital signs in last 24 hours: Temp:  [97.8 F (36.6 C)-99.1 F (37.3 C)] 98.6 F (37 C) (11/06 0523) Pulse Rate:  [50-89] 71 (11/06 0523) Resp:  [12-18] 18 (11/06 0523) BP: (143-179)/(78-99) 149/85 mmHg (11/06 0523) SpO2:  [90 %-100 %] 95 % (11/06 0523)    Intake/Output from previous day: 11/05 0701 - 11/06 0700 In: 2511.7 [P.O.:720; I.V.:1791.7] Out: 3150 [Urine:3150] Intake/Output this shift: Total I/O In: 240 [P.O.:240] Out: 300 [Urine:300]  PE: Skin: right axillary wound unpacked.  No further drainage noted.  No erythema or induration today.  repacked  Lab Results:   Recent Labs  12/06/13 1852  WBC 15.9*  HGB 13.2  HCT 37.4  PLT 279   BMET  Recent Labs  12/06/13 1852  NA 136*  K 3.8  CL 102  CO2 22  GLUCOSE 118*  BUN 8  CREATININE 0.68  CALCIUM 9.5   PT/INR No results for input(s): LABPROT, INR in the last 72 hours. CMP     Component Value Date/Time   NA 136* 12/06/2013 1852   K 3.8 12/06/2013 1852   CL 102 12/06/2013 1852   CO2 22 12/06/2013 1852   GLUCOSE 118* 12/06/2013 1852   BUN 8 12/06/2013 1852   CREATININE 0.68 12/06/2013 1852   CALCIUM 9.5 12/06/2013 1852   PROT 7.2 02/24/2013 0930   ALBUMIN 3.8 02/24/2013 0930   AST 14 02/24/2013 0930   ALT 14 02/24/2013 0930   ALKPHOS 55 02/24/2013 0930   BILITOT 0.4 02/24/2013 0930   GFRNONAA >90 12/06/2013 1852   GFRAA >90 12/06/2013 1852   Lipase     Component Value Date/Time   LIPASE 69* 06/27/2012 2300       Studies/Results: No results found.  Anti-infectives: Anti-infectives    Start     Dose/Rate Route Frequency Ordered Stop   12/07/13 0200  Ampicillin-Sulbactam (UNASYN) 3 g in sodium chloride 0.9 % 100 mL IVPB     3 g100 mL/hr over 60 Minutes Intravenous Every 6 hours 12/06/13 1818     12/06/13 1900   Ampicillin-Sulbactam (UNASYN) 3 g in sodium chloride 0.9 % 100 mL IVPB     3 g100 mL/hr over 60 Minutes Intravenous  Once 12/06/13 1827 12/06/13 2114       Assessment/Plan  1. POD 1, s/p I&D of right axillary abscess  Plan: 1. Start BID dressing changes today.  Plan for home hopefully tomorrow if pain can be better controlled with pain meds. 2. Can switch to oral abx therapy tomorrow at dc 3. Follow up arranged.   LOS: 2 days    Massimo Hartland E 12/08/2013, 10:47 AM Pager: [phone]

## 2013-12-08 NOTE — Care Management Note (Signed)
    Page 1 of 1   12/08/2013     3:22:08 PM CARE MANAGEMENT NOTE 12/08/2013  Patient:  Yolanda Richard,Yolanda Richard   Account Number:  [id number]  Date Initiated:  12/08/2013  Documentation initiated by:  Lorenda IshiharaPEELE,Marijean Montanye  Subjective/Objective Assessment:   43 yo female admitted s/p I&D axillary abscess. PTA lived at home with mother.     Action/Plan:   Home when stable, likely need assistance with dressing changes.   Anticipated DC Date:  12/09/2013   Anticipated DC Plan:  HOME W HOME HEALTH SERVICES      DC Planning Services  CM consult      [facility]   Choice offered to / List presented to:  C-1 Patient        HH arranged  HH-1 RN  HH-10 DISEASE MANAGEMENT      Status of service:  Completed, signed off Medicare Important Message given?   (If response is "NO", the following Medicare IM given date fields will be blank) Date Medicare IM given:   Medicare IM given by:   Date Additional Medicare IM given:   Additional Medicare IM given by:    Discharge Disposition:  HOME W HOME HEALTH SERVICES  Per UR Regulation:  Reviewed for med. necessity/level of care/duration of stay  If discussed at Long Length of Stay Meetings, dates discussed:    Comments:  12-08-13 Lorenda IshiharaSuzanne Jason Hauge RN CM 1500 Spoke with patient at bedside. Discussed need for teachable caregiver to assist with dressing changes especially if continues BID. She understands and will arrange. [facility]. Contacted [facility] to follow.

## 2013-12-09 LAB — CULTURE, ROUTINE-ABSCESS

## 2013-12-09 MED ORDER — OXYCODONE HCL 5 MG PO TABS: 5.0000 mg | ORAL_TABLET | ORAL | Status: DC | PRN

## 2013-12-09 MED ORDER — AMOXICILLIN-POT CLAVULANATE 875-125 MG PO TABS
1.0000 | ORAL_TABLET | Freq: Two times a day (BID) | ORAL | Status: DC
Start: 2013-12-09 — End: 2014-03-21

## 2013-12-09 NOTE — Discharge Summary (Signed)
Reviewed discharge instructions with pt including follow-up appointment, plans for home health, and wound care.  She verbalized understanding of all instructions without questions.  Pt being released into care of her cousin.

## 2013-12-09 NOTE — Progress Notes (Signed)
Patient ID: Marice PotterShaunita Longwell, female   DOB: 1970-04-07, 43 y.o.   MRN: [id number] 2 Days Post-Op  Subjective: Feels better. Less pain. No other complaints.  Objective: Vital signs in last 24 hours: Temp:  [98.2 F (36.8 C)-99.1 F (37.3 C)] 98.6 F (37 C) (11/07 0600) Pulse Rate:  [52-62] 56 (11/07 0600) Resp:  [16-18] 17 (11/07 0600) BP: (124-177)/(80-98) 124/89 mmHg (11/07 0600) SpO2:  [98 %-99 %] 98 % (11/07 0600)    Intake/Output from previous day: 11/06 0701 - 11/07 0700 In: 2100 [P.O.:900; I.V.:1200] Out: 2050 [Urine:2050] Intake/Output this shift:    General appearance: alert, cooperative and no distress Incision/Wound: right axillary packing removed. Wound is clean without unusual drainage or erythema or induration. Wound repacked with moist saline gauze.  Lab Results:   Recent Labs  12/06/13 1852  WBC 15.9*  HGB 13.2  HCT 37.4  PLT 279   BMET  Recent Labs  12/06/13 1852  NA 136*  K 3.8  CL 102  CO2 22  GLUCOSE 118*  BUN 8  CREATININE 0.68  CALCIUM 9.5     Studies/Results: No results found.  Anti-infectives: Anti-infectives    Start     Dose/Rate Route Frequency Ordered Stop   12/07/13 0200  Ampicillin-Sulbactam (UNASYN) 3 g in sodium chloride 0.9 % 100 mL IVPB     3 g100 mL/hr over 60 Minutes Intravenous Every 6 hours 12/06/13 1818     12/06/13 1900  Ampicillin-Sulbactam (UNASYN) 3 g in sodium chloride 0.9 % 100 mL IVPB     3 g100 mL/hr over 60 Minutes Intravenous  Once 12/06/13 1827 12/06/13 2114      Assessment/Plan: s/p Procedure(s): EXCISION HIDRADENITIS AXILLA right axilla abscess  Doing well. Okay for discharge with home health for dressing changes and follow-up oral antibiotics.    LOS: 3 days    Shavette Shoaff T 12/09/2013

## 2013-12-09 NOTE — Plan of Care (Signed)
Problem: Phase II Progression Outcomes Goal: Return of bowel function (flatus, BM) IF ABDOMINAL SURGERY:  Outcome: Adequate for Discharge Positive flatus.  Pt verbalized understanding of strategies and OTC meds to promote BM.

## 2013-12-12 LAB — ANAEROBIC CULTURE

## 2013-12-19 ENCOUNTER — Telehealth: Payer: Self-pay | Admitting: Family

## 2013-12-19 NOTE — Telephone Encounter (Signed)
Heather rn from [facility]  is calling to let NP know that pt is not taking her hctz consistently. Pt bp today  140/92. Heather ask pt to keep bp log and she will see pt on 12-21-13

## 2014-01-04 ENCOUNTER — Telehealth: Payer: Self-pay

## 2014-01-04 NOTE — Telephone Encounter (Signed)
Spoke with pt to advise that Padonda and Dr. Selena BattenKim agree that she should be following up with surgeon for surgical site pain. Pt sounds irritated and states that her issue is not with the surgery site. She says that issue is on the rt side axillary but on the back side. Pt requested an abx saying that Oran Reinadonda knows she gets boils and has prescribed doxycycline and she needs a refill. Oran Reinadonda has seen pt in the office twice, most recently 11/03/13 for the abscess of rt axilla. Pt insists that her current issue is not with the surgical site therefore she does not need to f/u with the surgeon and she will come in tomorrow.   Message [facility] to [facility] and Dr. Selena BattenKim as Lorain Childesfyi

## 2014-01-05 ENCOUNTER — Ambulatory Visit: Payer: BC Managed Care – PPO | Admitting: Family Medicine

## 2014-01-05 MED ORDER — DOXYCYCLINE HYCLATE 100 MG PO TABS
100.0000 mg | ORAL_TABLET | Freq: Two times a day (BID) | ORAL | Status: DC
Start: 2014-01-05 — End: 2014-03-21

## 2014-01-05 NOTE — Addendum Note (Signed)
Addended byAdline Mango: CAMPBELL, Briona Korpela B on: 01/05/2014 11:11 AM   Modules accepted: Orders

## 2014-01-05 NOTE — Telephone Encounter (Signed)
Left message to advise pt  

## 2014-01-05 NOTE — Telephone Encounter (Signed)
Sent in Doxycycline. Needs to let surgeon know what's going on.

## 2014-03-20 ENCOUNTER — Ambulatory Visit (INDEPENDENT_AMBULATORY_CARE_PROVIDER_SITE_OTHER): Payer: BLUE CROSS/BLUE SHIELD | Admitting: Family

## 2014-03-20 ENCOUNTER — Other Ambulatory Visit (HOSPITAL_COMMUNITY)
Admission: RE | Admit: 2014-03-20 | Discharge: 2014-03-20 | Disposition: A | Discharge status: Home / Self Care | Payer: BLUE CROSS/BLUE SHIELD | Source: Ambulatory Visit | Attending: Family | Admitting: Family

## 2014-03-20 ENCOUNTER — Encounter: Payer: Self-pay | Admitting: Family

## 2014-03-20 VITALS — BP 140/90 | Temp 98.8°F | Wt 164.5 lb

## 2014-03-20 DIAGNOSIS — L732 Hidradenitis suppurativa: Secondary | ICD-10-CM

## 2014-03-20 DIAGNOSIS — L02234 Carbuncle of groin: Secondary | ICD-10-CM

## 2014-03-20 DIAGNOSIS — Z113 Encounter for screening for infections with a predominantly sexual mode of transmission: Secondary | ICD-10-CM | POA: Insufficient documentation

## 2014-03-20 DIAGNOSIS — Z01419 Encounter for gynecological examination (general) (routine) without abnormal findings: Secondary | ICD-10-CM | POA: Insufficient documentation

## 2014-03-20 DIAGNOSIS — N760 Acute vaginitis: Secondary | ICD-10-CM | POA: Diagnosis present

## 2014-03-20 DIAGNOSIS — L02224 Furuncle of groin: Secondary | ICD-10-CM

## 2014-03-20 LAB — CBC WITH DIFFERENTIAL/PLATELET
BASOS PCT: 0.4 % (ref 0.0–3.0)
Basophils Absolute: 0.1 10*3/uL (ref 0.0–0.1)
EOS ABS: 0.2 10*3/uL (ref 0.0–0.7)
EOS PCT: 1 % (ref 0.0–5.0)
HEMATOCRIT: 43.4 % (ref 36.0–46.0)
Hemoglobin: 14.8 g/dL (ref 12.0–15.0)
LYMPHS ABS: 3.4 10*3/uL (ref 0.7–4.0)
Lymphocytes Relative: 19.3 % (ref 12.0–46.0)
MCHC: 34.1 g/dL (ref 30.0–36.0)
MCV: 87 fl (ref 78.0–100.0)
MONO ABS: 0.5 10*3/uL (ref 0.1–1.0)
Monocytes Relative: 3 % (ref 3.0–12.0)
Neutro Abs: 13.5 10*3/uL — ABNORMAL HIGH (ref 1.4–7.7)
Neutrophils Relative %: 76.3 % (ref 43.0–77.0)
Platelets: 300 10*3/uL (ref 150.0–400.0)
RBC: 4.99 Mil/uL (ref 3.87–5.11)
RDW: 15.8 % — ABNORMAL HIGH (ref 11.5–15.5)
WBC: 17.7 10*3/uL — AB (ref 4.0–10.5)

## 2014-03-20 LAB — LUTEINIZING HORMONE: LH: 10.88 m[IU]/mL

## 2014-03-20 LAB — TSH: TSH: 3.45 u[IU]/mL (ref 0.35–4.50)

## 2014-03-20 LAB — FOLLICLE STIMULATING HORMONE: FSH: 43.1 m[IU]/mL

## 2014-03-20 MED ORDER — OXYCODONE HCL 5 MG PO TABS: 5.0000 mg | ORAL_TABLET | ORAL | Status: DC | PRN

## 2014-03-20 MED ORDER — HYDROCHLOROTHIAZIDE 12.5 MG PO CAPS: 12.5000 mg | ORAL_CAPSULE | Freq: Every day | ORAL | Status: DC

## 2014-03-20 MED ORDER — PREDNISONE 20 MG PO TABS: ORAL_TABLET | ORAL | Status: AC

## 2014-03-20 NOTE — Progress Notes (Signed)
Subjective:    Patient ID: Yolanda Richard, female    DOB: 1970/05/25, 44 y.o.   MRN: [id number]  HPI 44 year old African-American female with a history of hidradenitis supportive is in today with a flare to her genital area. She is crawling very frustrated with regards to this issue reoccurring. She's been referred to a surgeon for an axilla issue that has resolved. His requesting. She has pain 10 out of 10. Worse with sitting.   Review of Systems  Constitutional: Negative.   Respiratory: Negative.   Cardiovascular: Negative.   Gastrointestinal: Negative.   Endocrine: Negative.   Genitourinary: Negative.   Musculoskeletal: Negative.   Skin:       Boils the groin area.   Neurological: Negative.   Psychiatric/Behavioral: Negative.    Past Medical History  Diagnosis Date  . Preterm labor   . Hypertension   . Trichomonas   . Hidradenitis suppurativa     History   Social History  . Marital Status: Divorced    Spouse Name: N/A  . Number of Children: N/A  . Years of Education: N/A   Occupational History  . Not on file.   Social History Main Topics  . Smoking status: Current Every Day Smoker -- 0.75 packs/day for 16 years    Types: Cigarettes  . Smokeless tobacco: Never Used  . Alcohol Use: Yes     Comment: occasional  . Drug Use: No  . Sexual Activity: Yes    Birth Control/ Protection: Condom, Surgical   Other Topics Concern  . Not on file   Social History Narrative    Past Surgical History  Procedure Laterality Date  . Abdominal hysterectomy    . Mouth surgery    . Hydradenitis excision Right 12/07/2013    Procedure: EXCISION HIDRADENITIS AXILLA right axilla abscess ;  Surgeon: Yolanda LoronMatthew Wakefield, MD;  Location: [facility];  Service: General;  Laterality: Right;    Family History  Problem Relation Age of Onset  . Other Neg Hx   . Cancer Mother   . Cancer Maternal Aunt   . Stroke Paternal Uncle   . Diabetes Paternal Uncle   . Cancer Maternal  Grandmother   . Cancer Paternal Grandmother     Allergies  Allergen Reactions  . Ace Inhibitors Swelling    angioedema    Current Outpatient Prescriptions on File Prior to Visit  Medication Sig Dispense Refill  . amoxicillin-clavulanate (AUGMENTIN) 875-125 MG per tablet Take 1 tablet by mouth 2 (two) times daily. (Patient not taking: Reported on 03/20/2014) 14 tablet 3  . doxycycline (VIBRA-TABS) 100 MG tablet Take 1 tablet (100 mg total) by mouth 2 (two) times daily. (Patient not taking: Reported on 03/20/2014) 20 tablet 0  . ibuprofen (ADVIL,MOTRIN) 200 MG tablet Take 600-800 mg by mouth every 6 (six) hours as needed for moderate pain.    Marland Kitchen. OVER THE COUNTER MEDICATION Take 2 tablets by mouth daily.     No current facility-administered medications on file prior to visit.    BP 140/90 mmHg  Temp(Src) 98.8 F (37.1 C) (Oral)  Wt 164 lb 8 oz (74.617 kg)chart    Objective:   Physical Exam  Constitutional: She is oriented to person, place, and time. She appears well-developed and well-nourished.  Neck: Normal range of motion. Neck supple.  Cardiovascular: Normal rate, regular rhythm and normal heart sounds.   Pulmonary/Chest: Effort normal and breath sounds normal.  Abdominal: Soft. Bowel sounds are normal.  Neurological: She is alert and oriented to  person, place, and time.  Skin: Skin is warm and dry. Rash noted. Rash is pustular.             Assessment & Plan:  Yolanda Richard was seen today for vaginal discharge.  Diagnoses and all orders for this visit:  Hidradenitis suppurativa Orders: -     TSH -     Follicle Stimulating Hormone -     Luteinizing Hormone -     CBC with Differential -     HSV 2 Antibody, IgG -     HIV antibody (with reflex) -     PAP [[facility]] -     Ambulatory referral to Dermatology  Boil, groin  Other orders -     hydrochlorothiazide (MICROZIDE) 12.5 MG capsule; Take 1 capsule (12.5 mg total) by mouth daily. -     predniSONE (DELTASONE) 20  MG tablet;  PO qam x 3 days,  po qam x 3 days,  qam x 3 days -     oxyCODONE (OXY IR/ROXICODONE) 5 MG immediate release tablet; Take 1-2 tablets (5-10 mg total) by mouth every 4 (four) hours as needed for moderate pain.   Draining balls of the genitalia. Discuss possible treatment with steroid injections versus oral retinoid. See dermatology for management. Epsom salt baths.

## 2014-03-20 NOTE — Patient Instructions (Signed)
Hidradenitis Suppurativa, Sweat Gland Abscess Hidradenitis suppurativa is a long lasting (chronic), uncommon disease of the sweat glands. With this, boil-like lumps and scarring develop in the groin, some times under the arms (axillae), and under the breasts. It may also uncommonly occur behind the ears, in the crease of the buttocks, and around the genitals.  CAUSES  The cause is from a blocking of the sweat glands. They then become infected. It may cause drainage and odor. It is not contagious. So it cannot be given to someone else. It most often shows up in puberty (about 10 to 42 years of age). But it may happen much later. It is similar to acne which is a disease of the sweat glands. This condition is slightly more common in African-Americans and women. SYMPTOMS   Hidradenitis usually starts as one or more red, tender, swellings in the groin or under the arms (axilla).  Over a period of hours to days the lesions get larger. They often open to the skin surface, draining clear to yellow-colored fluid.  The infected area heals with scarring. DIAGNOSIS  Your caregiver makes this diagnosis by looking at you. Sometimes cultures (growing germs on plates in the lab) may be taken. This is to see what germ (bacterium) is causing the infection.  TREATMENT   Topical germ killing medicine applied to the skin (antibiotics) are the treatment of choice. Antibiotics taken by mouth (systemic) are sometimes needed when the condition is getting worse or is severe.  Avoid tight-fitting clothing which traps moisture in.  Dirt does not cause hidradenitis and it is not caused by poor hygiene.  Involved areas should be cleaned daily using an antibacterial soap. Some patients find that the liquid form of Lever 2000, applied to the involved areas as a lotion after bathing, can help reduce the odor related to this condition.  Sometimes surgery is needed to drain infected areas or remove scarred tissue. Removal of  large amounts of tissue is used only in severe cases.  Birth control pills may be helpful.  Oral retinoids (vitamin A derivatives) for 6 to 12 months which are effective for acne may also help this condition.  Weight loss will improve but not cure hidradenitis. It is made worse by being overweight. But the condition is not caused by being overweight.  This condition is more common in people who have had acne.  It may become worse under stress. There is no medical cure for hidradenitis. It can be controlled, but not cured. The condition usually continues for years with periods of getting worse and getting better (remission). Document Released: 09/03/2003 Document Revised: 04/13/2011 Document Reviewed: 04/21/2013 [employer] Patient Information 2015 [employer]. This information is not intended to replace advice given to you by your health care provider. Make sure you discuss any questions you have with your health care provider.  

## 2014-03-21 ENCOUNTER — Other Ambulatory Visit: Payer: Self-pay | Admitting: Family

## 2014-03-21 ENCOUNTER — Telehealth: Payer: Self-pay

## 2014-03-21 ENCOUNTER — Encounter: Payer: Self-pay | Admitting: Family

## 2014-03-21 DIAGNOSIS — Z205 Contact with and (suspected) exposure to viral hepatitis: Secondary | ICD-10-CM

## 2014-03-21 LAB — HSV 2 ANTIBODY, IGG: HSV 2 Glycoprotein G Ab, IgG: 13.74 IV — ABNORMAL HIGH

## 2014-03-21 LAB — CYTOLOGY - PAP

## 2014-03-21 LAB — HIV ANTIBODY (ROUTINE TESTING W REFLEX): HIV 1&2 Ab, 4th Generation: NONREACTIVE

## 2014-03-21 MED ORDER — METRONIDAZOLE 0.75 % VA GEL: 1.0000 | Freq: Every day | VAGINAL | Status: DC

## 2014-03-21 MED ORDER — METRONIDAZOLE 500 MG PO TABS: 500.0000 mg | ORAL_TABLET | Freq: Two times a day (BID) | ORAL | Status: DC

## 2014-03-21 MED ORDER — DOXYCYCLINE HYCLATE 100 MG PO TABS: 100.0000 mg | ORAL_TABLET | Freq: Two times a day (BID) | ORAL | Status: DC

## 2014-03-21 NOTE — Telephone Encounter (Signed)
-----   Message from Baker PieriniPadonda B Campbell, FNP sent at 03/21/2014  3:43 PM EST ----- Positive Trichomonas. Needs antibiotic treatment and sex partners will as well. I will send in Flagyl. No sex x 1 week. Safe sex practices.

## 2014-03-21 NOTE — Telephone Encounter (Signed)
Pt aware but cannot take Flagyl due to vomiting

## 2014-03-22 ENCOUNTER — Other Ambulatory Visit (INDEPENDENT_AMBULATORY_CARE_PROVIDER_SITE_OTHER): Payer: BLUE CROSS/BLUE SHIELD

## 2014-03-22 DIAGNOSIS — Z205 Contact with and (suspected) exposure to viral hepatitis: Secondary | ICD-10-CM

## 2014-03-22 LAB — CERVICOVAGINAL ANCILLARY ONLY
BACTERIAL VAGINITIS: POSITIVE — AB
BACTERIAL VAGINITIS: POSITIVE — AB
Bacterial vaginitis: POSITIVE — AB
Bacterial vaginitis: POSITIVE — AB
Candida vaginitis: NEGATIVE

## 2014-03-23 LAB — HEPATITIS PANEL, ACUTE
HCV Ab: NEGATIVE
Hep A IgM: NONREACTIVE
Hep B C IgM: NONREACTIVE
Hepatitis B Surface Ag: NEGATIVE

## 2014-06-11 IMAGING — US US PELVIS COMPLETE
1 series · 13 of 25 positions shown · non-contrast
Comparison: None

CLINICAL DATA: Pain.  Look for ovarian cyst.  Left lower quadrant
pain.  Prior hysterectomy in 1448.  Gravida 5, para 4, aborta
1...



[Series 1: us pelvis complete · 13 of 31 slices shown]
[im 1/31]
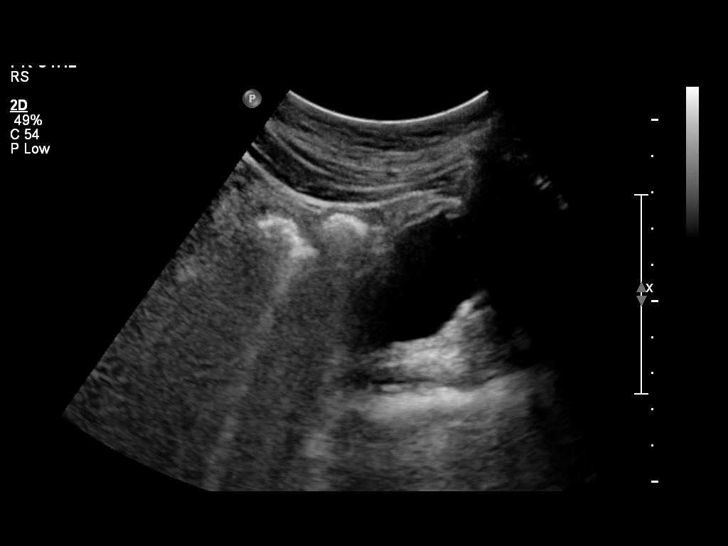
[im 3/31]
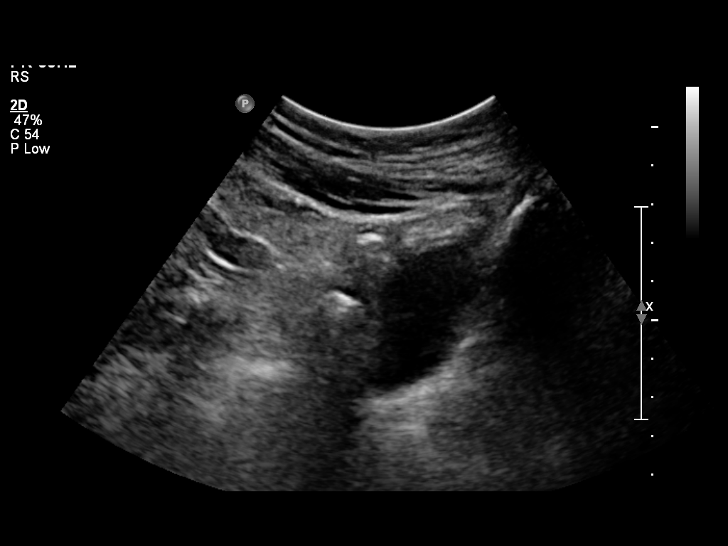
[im 6/31]
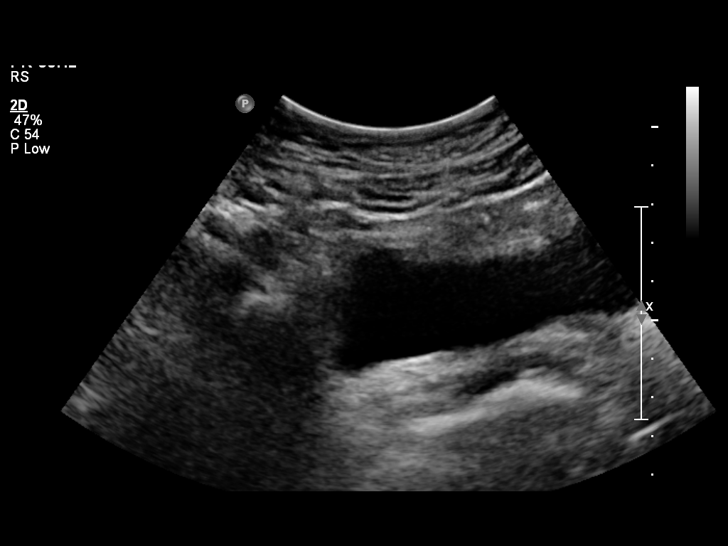
[im 8/31]
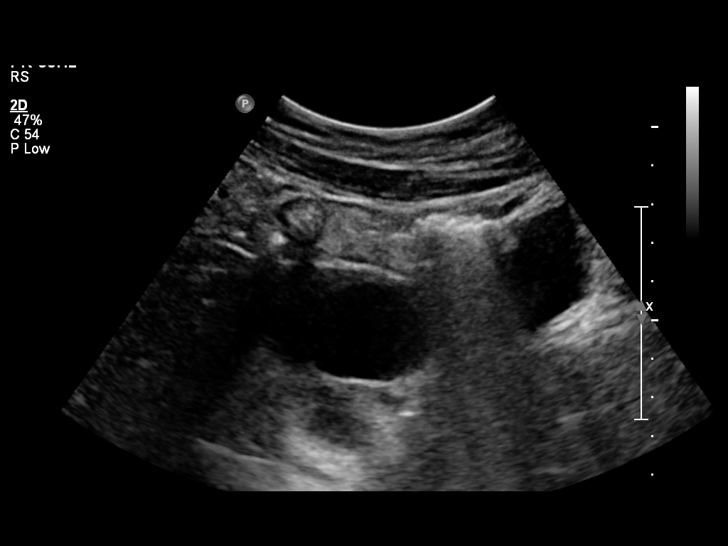
[im 11/31]
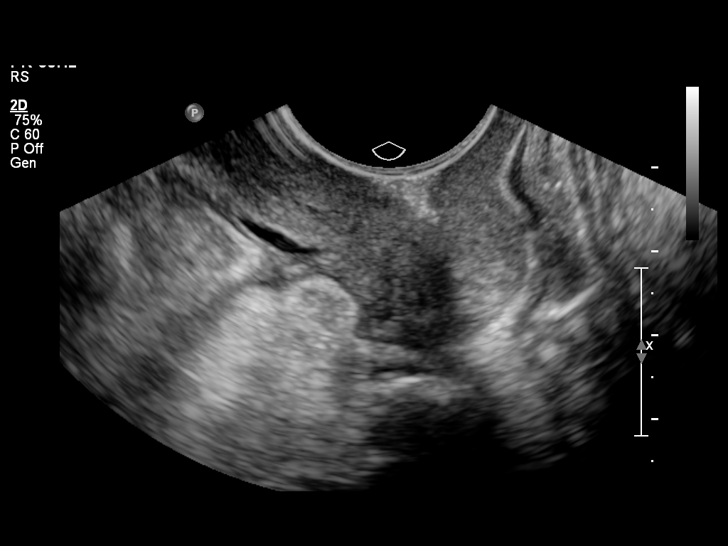
[im 13/31]
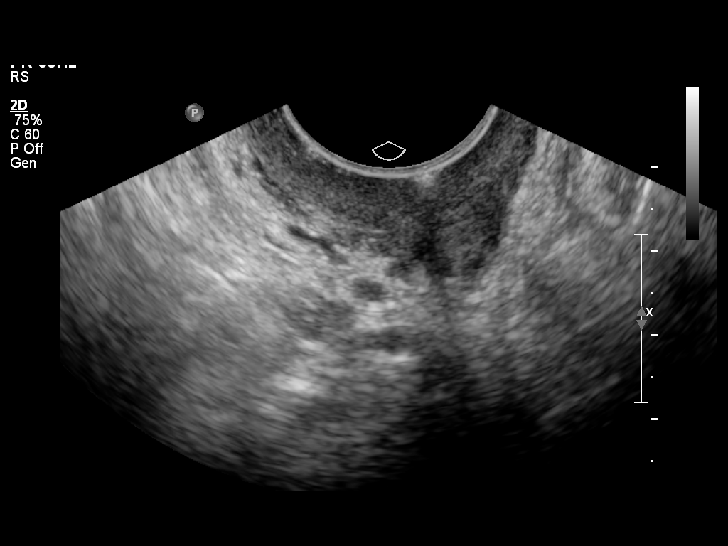
[im 16/31]
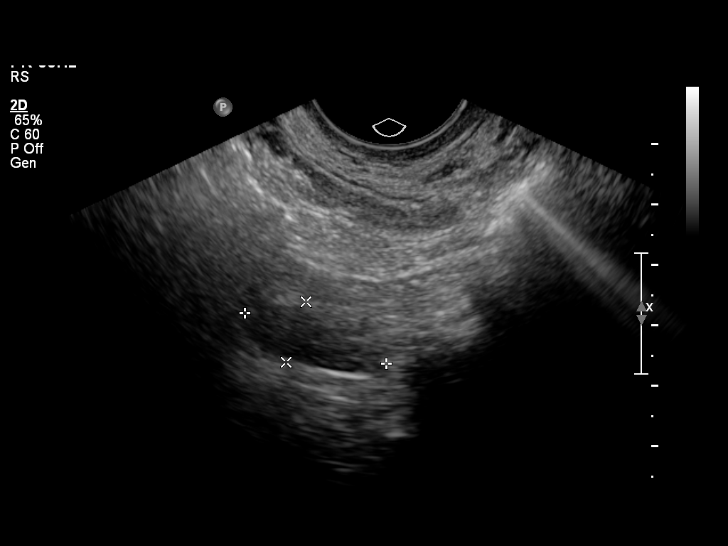
[im 18/31]
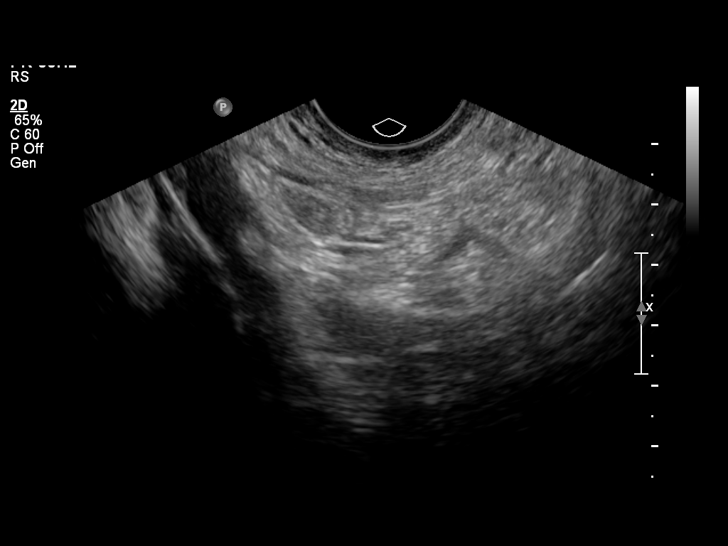
[im 21/31]
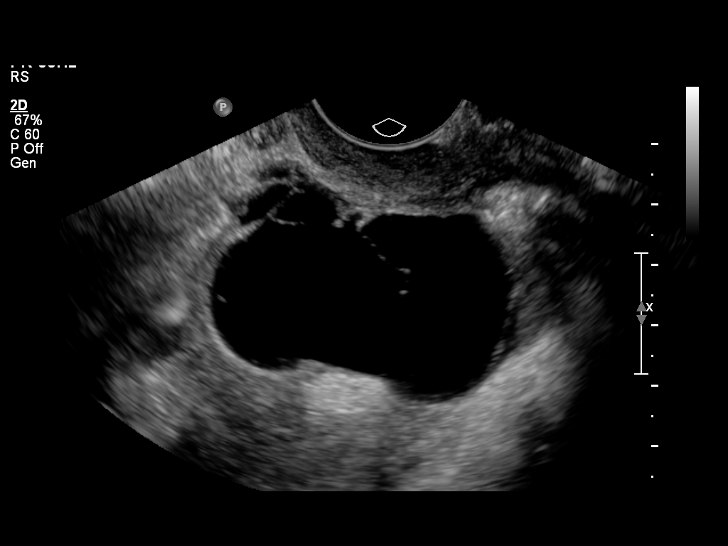
[im 23/31]
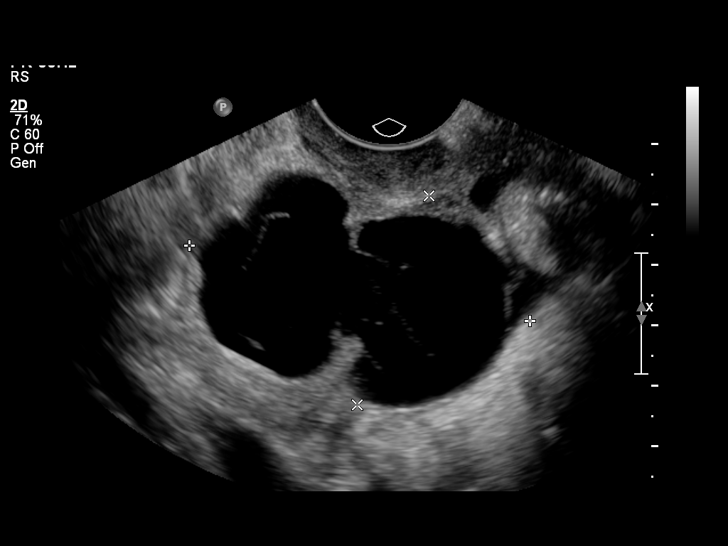
[im 26/31]
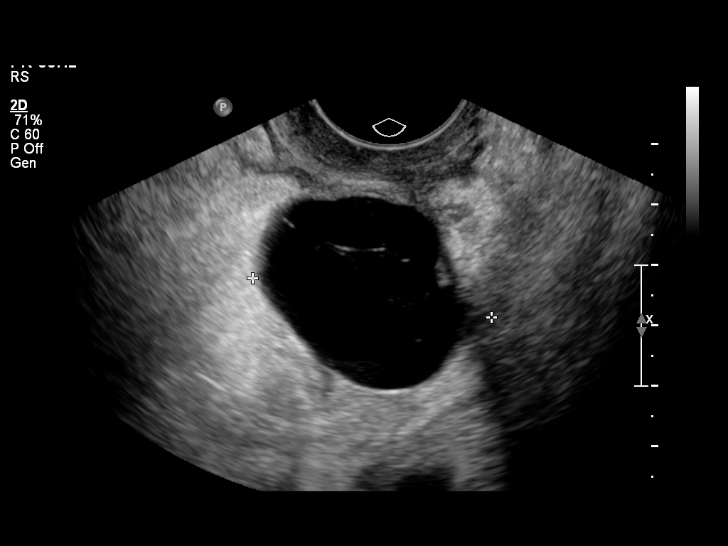
[im 28/31]
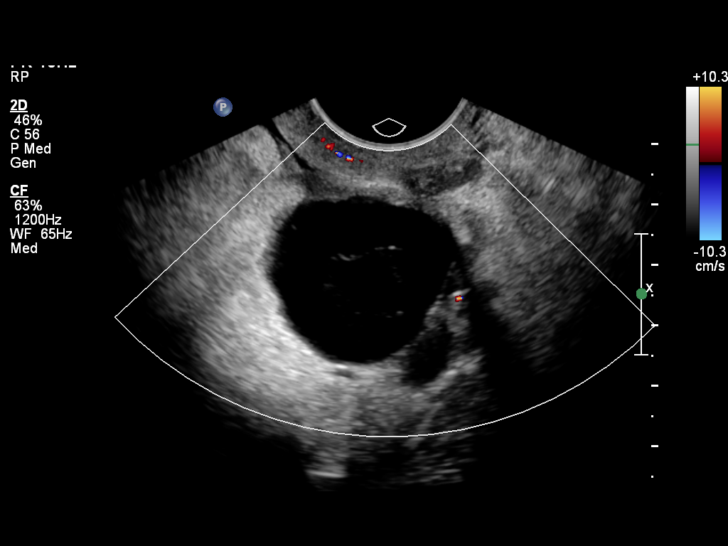
[im 31/31]
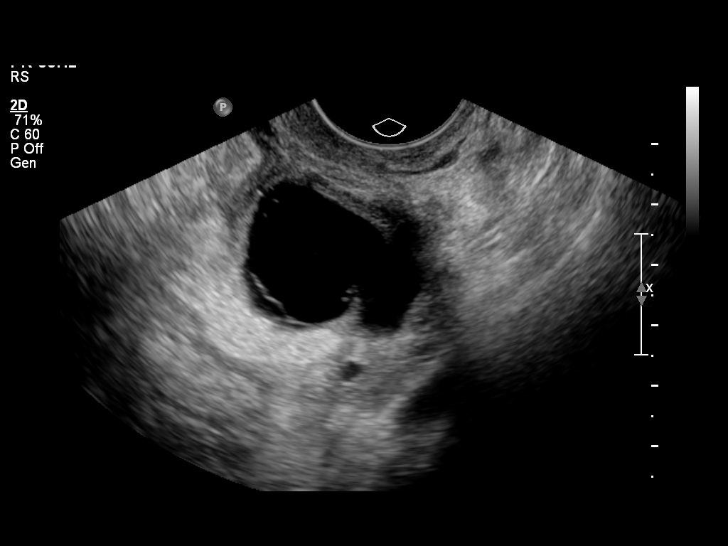

[13 of 25 positions shown; findings below may reference images not displayed]

FINDINGS: Uterus: Patient has had hysterectomy.

Endometrium: The patient has had hysterectomy.

Right ovary:  2.5 x 1.1 x 1.7 cm, normal in appearance.

Left ovary: Complex cystic mass containing internal septations
measuring 5.8 x 3.7 x 4.0 cm.  No normal left ovarian tissue
identified.

Other findings: No free fluid
IMPRESSION: 1.  Status post left hysterectomy.
2.  Normal-appearing right ovary.
3.  Complex left adnexal cystic mass, 5.8 cm in diameter.
Differential diagnosis includes physiologic ovarian cyst,
hydrosalpinx, or ovarian neoplasm.  Tubo-ovarian abscess is felt to
be less likely given the appearance.  Follow-up is suggested given
the complex appearance.  Recommend follow-up pelvic ultrasound in 6-
8 weeks.

## 2014-06-12 ENCOUNTER — Encounter: Payer: Self-pay | Admitting: Family

## 2014-06-12 ENCOUNTER — Ambulatory Visit (INDEPENDENT_AMBULATORY_CARE_PROVIDER_SITE_OTHER): Payer: BLUE CROSS/BLUE SHIELD | Admitting: Family

## 2014-06-12 VITALS — BP 126/84 | HR 97 | Temp 98.3°F | Wt 165.5 lb

## 2014-06-12 DIAGNOSIS — L732 Hidradenitis suppurativa: Secondary | ICD-10-CM

## 2014-06-12 MED ORDER — OXYCODONE HCL 5 MG PO TABS: 5.0000 mg | ORAL_TABLET | ORAL | Status: DC | PRN

## 2014-06-12 MED ORDER — MEPERIDINE HCL 25 MG/ML IJ SOLN
25.0000 mg | Freq: Once | INTRAMUSCULAR | Status: AC
  Administered 2014-06-12: 25 mg via INTRAMUSCULAR

## 2014-06-12 MED ORDER — PROMETHAZINE HCL 25 MG/ML IJ SOLN: 25.0000 mg | Freq: Once | INTRAMUSCULAR | Status: DC

## 2014-06-12 MED ORDER — DOXYCYCLINE HYCLATE 100 MG PO TABS: 100.0000 mg | ORAL_TABLET | Freq: Two times a day (BID) | ORAL | Status: DC

## 2014-06-12 MED ORDER — PROMETHAZINE HCL 25 MG/ML IJ SOLN
25.0000 mg | Freq: Once | INTRAMUSCULAR | Status: AC
  Administered 2014-06-12: 25 mg via INTRAMUSCULAR

## 2014-06-12 NOTE — Patient Instructions (Signed)
Incision and Drainage Incision and drainage is a procedure in which a sac-like structure (cystic structure) is opened and drained. The area to be drained usually contains material such as pus, fluid, or blood.  LET YOUR CAREGIVER KNOW ABOUT:   Allergies to medicine.  Medicines taken, including vitamins, herbs, eyedrops, over-the-counter medicines, and creams.  Use of steroids (by mouth or creams).  Previous problems with anesthetics or numbing medicines.  History of bleeding problems or blood clots.  Previous surgery.  Other health problems, including diabetes and kidney problems.  Possibility of pregnancy, if this applies. RISKS AND COMPLICATIONS  Pain.  Bleeding.  Scarring.  Infection. BEFORE THE PROCEDURE  You may need to have an ultrasound or other imaging tests to see how large or deep your cystic structure is. Blood tests may also be used to determine if you have an infection or how severe the infection is. You may need to have a tetanus shot. PROCEDURE  The affected area is cleaned with a cleaning fluid. The cyst area will then be numbed with a medicine (local anesthetic). A small incision will be made in the cystic structure. A syringe or catheter may be used to drain the contents of the cystic structure, or the contents may be squeezed out. The area will then be flushed with a cleansing solution. After cleansing the area, it is often gently packed with a gauze or another wound dressing. Once it is packed, it will be covered with gauze and tape or some other type of wound dressing. AFTER THE PROCEDURE   Often, you will be allowed to go home right after the procedure.  You may be given antibiotic medicine to prevent or heal an infection.  If the area was packed with gauze or some other wound dressing, you will likely need to come back in 1 to 2 days to get it removed.  The area should heal in about 14 days. Document Released: 07/15/2000 Document Revised: 07/21/2011  Document Reviewed: 03/16/2011 ExitCare Patient Information 2015 [employer]. This information is not intended to replace advice given to you by your health care provider. Make sure you discuss any questions you have with your health care provider.  

## 2014-06-13 ENCOUNTER — Telehealth: Payer: Self-pay

## 2014-06-13 ENCOUNTER — Other Ambulatory Visit: Payer: Self-pay

## 2014-06-13 DIAGNOSIS — Z1231 Encounter for screening mammogram for malignant neoplasm of breast: Secondary | ICD-10-CM

## 2014-06-13 NOTE — Telephone Encounter (Signed)
Called pt to see how she is doing s/p I&D yesterday. Pt states she is feeling better today and notes that Demerol 25mg  inj never "kicked in" yesterday. She also requests for general surgery referral instead of the office I&D. I advised pt that I would let Padonda Know
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
## 2014-06-20 ENCOUNTER — Ambulatory Visit
Admission: RE | Admit: 2014-06-20 | Discharge: 2014-06-20 | Disposition: A | Discharge status: Home / Self Care | Payer: BLUE CROSS/BLUE SHIELD | Source: Ambulatory Visit

## 2014-06-20 DIAGNOSIS — Z1231 Encounter for screening mammogram for malignant neoplasm of breast: Secondary | ICD-10-CM

## 2014-06-21 ENCOUNTER — Encounter: Payer: Self-pay | Admitting: Family

## 2014-06-21 DIAGNOSIS — F411 Generalized anxiety disorder: Secondary | ICD-10-CM | POA: Insufficient documentation

## 2014-07-03 ENCOUNTER — Encounter: Payer: Self-pay | Admitting: Family

## 2014-08-01 ENCOUNTER — Other Ambulatory Visit: Payer: Self-pay | Admitting: *Deleted

## 2014-08-01 ENCOUNTER — Encounter: Payer: Self-pay | Admitting: Family

## 2014-08-01 MED ORDER — VALACYCLOVIR HCL 1 G PO TABS: 1000.0000 mg | ORAL_TABLET | ORAL | Status: DC | PRN

## 2014-08-21 ENCOUNTER — Ambulatory Visit (INDEPENDENT_AMBULATORY_CARE_PROVIDER_SITE_OTHER): Payer: BLUE CROSS/BLUE SHIELD | Admitting: Family Medicine

## 2014-08-21 ENCOUNTER — Encounter: Payer: Self-pay | Admitting: Family Medicine

## 2014-08-21 VITALS — BP 138/90 | HR 68 | Temp 99.1°F | Wt 162.0 lb

## 2014-08-21 DIAGNOSIS — N63 Unspecified lump in breast: Secondary | ICD-10-CM | POA: Diagnosis not present

## 2014-08-21 DIAGNOSIS — N6325 Unspecified lump in the left breast, overlapping quadrants: Secondary | ICD-10-CM

## 2014-08-21 DIAGNOSIS — N6320 Unspecified lump in the left breast, unspecified quadrant: Principal | ICD-10-CM

## 2014-08-21 NOTE — Progress Notes (Signed)
   Subjective:    Patient ID: Yolanda Richard, female    DOB: Sep 11, 1970, 44 y.o.   MRN: [id number]  HPI Here to check a tender lump in the left breast that she discovered yesterday. Her entire breast is a little tender and it feels swollen. No visible skin changes and no nipple fluid. Of note she was worked up for a similar lump in 2013, and an US at that time confirmed 2 simple cysts. She has then had 2 totally normal mammograms since then, most recently on 06-20-14. She has a strong family hx of breast cancer, including her mother and several aunts.    Review of Systems  Constitutional: Negative.   Respiratory: Negative.   Cardiovascular: Negative.   Gastrointestinal: Negative.   Skin: Negative.        Objective:   Physical Exam  Constitutional: She appears well-developed and well-nourished.  Neck: No thyromegaly present.  Cardiovascular: Normal rate, regular rhythm, normal heart sounds and intact distal pulses.   Pulmonary/Chest: Effort normal and breath sounds normal.  Genitourinary:  The right breast and axilla is normal. The left breast shows normal skin and no nipple fluid. There is a 2 cm lump in the left breast at the 3 o'clock position which is quite tender, it is firm and well defined. The left axilla is clear.  Lymphadenopathy:    She has no cervical adenopathy.          Assessment & Plan:  She as a tender breast lump. We will set up a left sided mammogram and US soon to evaluate.

## 2014-08-27 ENCOUNTER — Ambulatory Visit
Admission: RE | Admit: 2014-08-27 | Discharge: 2014-08-27 | Disposition: A | Discharge status: Home / Self Care | Payer: BLUE CROSS/BLUE SHIELD | Source: Ambulatory Visit | Attending: Family Medicine | Admitting: Family Medicine

## 2014-08-27 ENCOUNTER — Other Ambulatory Visit: Payer: Self-pay | Admitting: Family Medicine

## 2014-08-27 DIAGNOSIS — N6320 Unspecified lump in the left breast, unspecified quadrant: Principal | ICD-10-CM

## 2014-08-27 DIAGNOSIS — N6325 Unspecified lump in the left breast, overlapping quadrants: Secondary | ICD-10-CM

## 2015-02-14 ENCOUNTER — Emergency Department (HOSPITAL_COMMUNITY): Payer: BLUE CROSS/BLUE SHIELD

## 2015-02-14 ENCOUNTER — Emergency Department (HOSPITAL_COMMUNITY)
Admission: EM | Admit: 2015-02-14 | Discharge: 2015-02-14 | Discharge status: Against Medical Advice | Payer: BLUE CROSS/BLUE SHIELD | Attending: Emergency Medicine | Admitting: Emergency Medicine

## 2015-02-14 ENCOUNTER — Encounter (HOSPITAL_COMMUNITY): Payer: Self-pay

## 2015-02-14 DIAGNOSIS — R0602 Shortness of breath: Secondary | ICD-10-CM | POA: Insufficient documentation

## 2015-02-14 DIAGNOSIS — R079 Chest pain, unspecified: Secondary | ICD-10-CM | POA: Insufficient documentation

## 2015-02-14 DIAGNOSIS — R42 Dizziness and giddiness: Secondary | ICD-10-CM | POA: Insufficient documentation

## 2015-02-14 DIAGNOSIS — I10 Essential (primary) hypertension: Secondary | ICD-10-CM | POA: Insufficient documentation

## 2015-02-14 DIAGNOSIS — F17210 Nicotine dependence, cigarettes, uncomplicated: Secondary | ICD-10-CM | POA: Insufficient documentation

## 2015-02-14 LAB — I-STAT TROPONIN, ED: Troponin i, poc: 0 ng/mL (ref 0.00–0.08)

## 2015-02-14 LAB — BASIC METABOLIC PANEL
ANION GAP: 10 (ref 5–15)
BUN: 8 mg/dL (ref 6–20)
CHLORIDE: 106 mmol/L (ref 101–111)
CO2: 22 mmol/L (ref 22–32)
Calcium: 9.2 mg/dL (ref 8.9–10.3)
Creatinine, Ser: 0.77 mg/dL (ref 0.44–1.00)
GFR calc Af Amer: >60 mL/min (ref >60)
Glucose, Bld: 112 mg/dL — ABNORMAL HIGH (ref 65–99)
POTASSIUM: 4.2 mmol/L (ref 3.5–5.1)
SODIUM: 138 mmol/L (ref 135–145)

## 2015-02-14 LAB — CBC
HCT: 43 % (ref 36.0–46.0)
HEMOGLOBIN: 14.2 g/dL (ref 12.0–15.0)
MCH: 29.6 pg (ref 26.0–34.0)
MCHC: 33 g/dL (ref 30.0–36.0)
MCV: 89.6 fL (ref 78.0–100.0)
Platelets: 255 10*3/uL (ref 150–400)
RBC: 4.8 MIL/uL (ref 3.87–5.11)
RDW: 14.2 % (ref 11.5–15.5)
WBC: 13.5 10*3/uL — AB (ref 4.0–10.5)

## 2015-02-14 NOTE — ED Notes (Signed)
Pt presents with c/o chest pain that started 2 days ago. Pt reports the pain is in the left side of her chest with associated shortness of breath and dizziness. Pt reports the pain is dull and aching in nature and is also constant.

## 2015-02-22 ENCOUNTER — Ambulatory Visit (HOSPITAL_COMMUNITY)
Admission: EM | Admit: 2015-02-22 | Discharge: 2015-02-24 | Disposition: A | Discharge status: Home / Self Care | Payer: Managed Care, Other (non HMO) | Attending: General Surgery | Admitting: General Surgery

## 2015-02-22 ENCOUNTER — Encounter (HOSPITAL_COMMUNITY): Payer: Self-pay | Admitting: Emergency Medicine

## 2015-02-22 DIAGNOSIS — L02412 Cutaneous abscess of left axilla: Secondary | ICD-10-CM | POA: Diagnosis present

## 2015-02-22 DIAGNOSIS — F17210 Nicotine dependence, cigarettes, uncomplicated: Secondary | ICD-10-CM | POA: Diagnosis not present

## 2015-02-22 DIAGNOSIS — L02419 Cutaneous abscess of limb, unspecified: Secondary | ICD-10-CM | POA: Diagnosis present

## 2015-02-22 DIAGNOSIS — J45909 Unspecified asthma, uncomplicated: Secondary | ICD-10-CM | POA: Diagnosis not present

## 2015-02-22 DIAGNOSIS — L732 Hidradenitis suppurativa: Secondary | ICD-10-CM

## 2015-02-22 DIAGNOSIS — Z79899 Other long term (current) drug therapy: Secondary | ICD-10-CM | POA: Insufficient documentation

## 2015-02-22 DIAGNOSIS — I10 Essential (primary) hypertension: Secondary | ICD-10-CM | POA: Insufficient documentation

## 2015-02-22 HISTORY — DX: Major depressive disorder, single episode, unspecified: F32.9

## 2015-02-22 HISTORY — DX: Unspecified asthma, uncomplicated: J45.909

## 2015-02-22 HISTORY — DX: Bipolar disorder, unspecified: F31.9

## 2015-02-22 HISTORY — DX: Anemia, unspecified: D64.9

## 2015-02-22 HISTORY — DX: Anxiety disorder, unspecified: F41.9

## 2015-02-22 HISTORY — DX: Depression, unspecified: F32.A

## 2015-02-22 NOTE — ED Notes (Signed)
Patient has an abscess underneath left arm. It is reddened and warm to touch. Patient states that this is something that happens due to her disease process. Patient is complaining of pain.

## 2015-02-22 NOTE — ED Notes (Signed)
Bed: WTR7 Expected date:  Expected time:  Means of arrival:  Comments: LAB Draw

## 2015-02-23 ENCOUNTER — Encounter (HOSPITAL_COMMUNITY): Payer: Self-pay | Admitting: Anesthesiology

## 2015-02-23 ENCOUNTER — Encounter (HOSPITAL_COMMUNITY)
Admission: EM | Disposition: A | Discharge status: Home / Self Care | Payer: Self-pay | Source: Home / Self Care | Attending: Emergency Medicine

## 2015-02-23 ENCOUNTER — Emergency Department (HOSPITAL_COMMUNITY): Payer: Managed Care, Other (non HMO) | Admitting: Anesthesiology

## 2015-02-23 HISTORY — PX: INCISION AND DRAINAGE ABSCESS: SHX5864

## 2015-02-23 LAB — BASIC METABOLIC PANEL
ANION GAP: 10 (ref 5–15)
BUN: 8 mg/dL (ref 6–20)
CALCIUM: 9 mg/dL (ref 8.9–10.3)
CO2: 23 mmol/L (ref 22–32)
CREATININE: 0.83 mg/dL (ref 0.44–1.00)
Chloride: 107 mmol/L (ref 101–111)
GFR calc Af Amer: >60 mL/min (ref >60)
GFR calc non Af Amer: >60 mL/min (ref >60)
GLUCOSE: 102 mg/dL — AB (ref 65–99)
Potassium: 3.9 mmol/L (ref 3.5–5.1)
Sodium: 140 mmol/L (ref 135–145)

## 2015-02-23 LAB — CBC WITH DIFFERENTIAL/PLATELET
Basophils Absolute: 0 10*3/uL (ref 0.0–0.1)
Basophils Relative: 0 %
Eosinophils Absolute: 0.2 10*3/uL (ref 0.0–0.7)
Eosinophils Relative: 1 %
HEMATOCRIT: 40.1 % (ref 36.0–46.0)
Hemoglobin: 13.6 g/dL (ref 12.0–15.0)
LYMPHS PCT: 24 %
Lymphs Abs: 2.9 10*3/uL (ref 0.7–4.0)
MCH: 30.2 pg (ref 26.0–34.0)
MCHC: 33.9 g/dL (ref 30.0–36.0)
MCV: 88.9 fL (ref 78.0–100.0)
MONO ABS: 0.7 10*3/uL (ref 0.1–1.0)
MONOS PCT: 6 %
NEUTROS ABS: 8 10*3/uL — AB (ref 1.7–7.7)
Neutrophils Relative %: 69 %
Platelets: 158 10*3/uL (ref 150–400)
RBC: 4.51 MIL/uL (ref 3.87–5.11)
RDW: 13.9 % (ref 11.5–15.5)
WBC: 11.8 10*3/uL — ABNORMAL HIGH (ref 4.0–10.5)

## 2015-02-23 LAB — LACTIC ACID, PLASMA: Lactic Acid, Venous: 0.7 mmol/L (ref 0.5–2.0)

## 2015-02-23 SURGERY — INCISION AND DRAINAGE, ABSCESS
Anesthesia: General | Site: Axilla | Laterality: Left

## 2015-02-23 MED ORDER — PROMETHAZINE HCL 25 MG/ML IJ SOLN: 6.2500 mg | INTRAMUSCULAR | Status: DC | PRN

## 2015-02-23 MED ORDER — PIPERACILLIN-TAZOBACTAM 3.375 G IVPB
3.3750 g | Freq: Three times a day (TID) | INTRAVENOUS | Status: DC
  Administered 2015-02-23 – 2015-02-24 (×3): 3.375 g via INTRAVENOUS
  Filled 2015-02-23 (×4): qty 50

## 2015-02-23 MED ORDER — HYDROMORPHONE HCL 1 MG/ML IJ SOLN
1.0000 mg | Freq: Once | INTRAMUSCULAR | Status: AC
  Administered 2015-02-23: 1 mg via INTRAVENOUS
  Filled 2015-02-23: qty 1

## 2015-02-23 MED ORDER — PROPOFOL 10 MG/ML IV BOLUS
INTRAVENOUS | Status: AC
  Filled 2015-02-23: qty 20

## 2015-02-23 MED ORDER — LIDOCAINE HCL (CARDIAC) 20 MG/ML IV SOLN
INTRAVENOUS | Status: DC | PRN
  Administered 2015-02-23: 50 mg via INTRAVENOUS

## 2015-02-23 MED ORDER — OXYCODONE HCL 5 MG PO TABS: 5.0000 mg | ORAL_TABLET | ORAL | Status: DC | PRN

## 2015-02-23 MED ORDER — DIPHENHYDRAMINE HCL 50 MG/ML IJ SOLN
12.5000 mg | Freq: Once | INTRAMUSCULAR | Status: AC
  Administered 2015-02-23: 12.5 mg via INTRAVENOUS
  Filled 2015-02-23: qty 1

## 2015-02-23 MED ORDER — HYDROMORPHONE HCL 1 MG/ML IJ SOLN
INTRAMUSCULAR | Status: AC
  Filled 2015-02-23: qty 1

## 2015-02-23 MED ORDER — LACTATED RINGERS IV SOLN: INTRAVENOUS | Status: DC

## 2015-02-23 MED ORDER — DOXYCYCLINE HYCLATE 50 MG PO CAPS: 100.0000 mg | ORAL_CAPSULE | Freq: Two times a day (BID) | ORAL | Status: DC

## 2015-02-23 MED ORDER — HYDROMORPHONE HCL 1 MG/ML IJ SOLN
0.2500 mg | INTRAMUSCULAR | Status: DC | PRN
  Administered 2015-02-23 (×4): 0.5 mg via INTRAVENOUS

## 2015-02-23 MED ORDER — PIPERACILLIN-TAZOBACTAM 3.375 G IVPB
3.3750 g | Freq: Once | INTRAVENOUS | Status: AC
  Administered 2015-02-23: 3.375 g via INTRAVENOUS
  Filled 2015-02-23: qty 50

## 2015-02-23 MED ORDER — MORPHINE SULFATE (PF) 4 MG/ML IV SOLN
4.0000 mg | Freq: Once | INTRAVENOUS | Status: AC
  Administered 2015-02-23: 4 mg via INTRAVENOUS
  Filled 2015-02-23: qty 1

## 2015-02-23 MED ORDER — ONDANSETRON HCL 4 MG/2ML IJ SOLN: 4.0000 mg | Freq: Four times a day (QID) | INTRAMUSCULAR | Status: DC | PRN

## 2015-02-23 MED ORDER — LACTATED RINGERS IV SOLN
INTRAVENOUS | Status: DC | PRN
  Administered 2015-02-23: 09:00:00 via INTRAVENOUS

## 2015-02-23 MED ORDER — FENTANYL CITRATE (PF) 100 MCG/2ML IJ SOLN
INTRAMUSCULAR | Status: DC | PRN
  Administered 2015-02-23 (×4): 25 ug via INTRAVENOUS

## 2015-02-23 MED ORDER — HYDROCHLOROTHIAZIDE 12.5 MG PO CAPS
12.5000 mg | ORAL_CAPSULE | Freq: Every day | ORAL | Status: DC
  Administered 2015-02-23 – 2015-02-24 (×2): 12.5 mg via ORAL
  Filled 2015-02-23 (×3): qty 1

## 2015-02-23 MED ORDER — MIDAZOLAM HCL 2 MG/2ML IJ SOLN
INTRAMUSCULAR | Status: AC
  Filled 2015-02-23: qty 2

## 2015-02-23 MED ORDER — ONDANSETRON HCL 4 MG/2ML IJ SOLN
INTRAMUSCULAR | Status: DC | PRN
  Administered 2015-02-23: 4 mg via INTRAVENOUS

## 2015-02-23 MED ORDER — OXYCODONE HCL 5 MG PO TABS
5.0000 mg | ORAL_TABLET | ORAL | Status: DC | PRN
  Administered 2015-02-23 – 2015-02-24 (×2): 5 mg via ORAL
  Administered 2015-02-24 (×2): 10 mg via ORAL
  Filled 2015-02-23 (×3): qty 2
  Filled 2015-02-23: qty 1

## 2015-02-23 MED ORDER — MORPHINE SULFATE (PF) 2 MG/ML IV SOLN
INTRAVENOUS | Status: AC
  Filled 2015-02-23: qty 1

## 2015-02-23 MED ORDER — MIDAZOLAM HCL 5 MG/5ML IJ SOLN
INTRAMUSCULAR | Status: DC | PRN
  Administered 2015-02-23: 2 mg via INTRAVENOUS

## 2015-02-23 MED ORDER — BUPIVACAINE-EPINEPHRINE (PF) 0.5% -1:200000 IJ SOLN
INTRAMUSCULAR | Status: AC
  Filled 2015-02-23: qty 30

## 2015-02-23 MED ORDER — FENTANYL CITRATE (PF) 100 MCG/2ML IJ SOLN
INTRAMUSCULAR | Status: AC
  Filled 2015-02-23: qty 2

## 2015-02-23 MED ORDER — BUPIVACAINE-EPINEPHRINE 0.5% -1:200000 IJ SOLN
INTRAMUSCULAR | Status: DC | PRN
  Administered 2015-02-23: 16 mL

## 2015-02-23 MED ORDER — PROPOFOL 10 MG/ML IV BOLUS
INTRAVENOUS | Status: DC | PRN
  Administered 2015-02-23: 150 mg via INTRAVENOUS

## 2015-02-23 MED ORDER — 0.9 % SODIUM CHLORIDE (POUR BTL) OPTIME
TOPICAL | Status: DC | PRN
  Administered 2015-02-23: 1000 mL

## 2015-02-23 MED ORDER — ONDANSETRON 4 MG PO TBDP: 4.0000 mg | ORAL_TABLET | Freq: Four times a day (QID) | ORAL | Status: DC | PRN

## 2015-02-23 MED ORDER — ONDANSETRON HCL 4 MG/2ML IJ SOLN
4.0000 mg | Freq: Once | INTRAMUSCULAR | Status: AC
  Administered 2015-02-23: 4 mg via INTRAVENOUS
  Filled 2015-02-23: qty 2

## 2015-02-23 MED ORDER — MORPHINE SULFATE (PF) 2 MG/ML IV SOLN
2.0000 mg | INTRAVENOUS | Status: DC | PRN
  Administered 2015-02-23 (×4): 2 mg via INTRAVENOUS
  Filled 2015-02-23 (×3): qty 1

## 2015-02-23 SURGICAL SUPPLY — 31 items
BLADE SURG 15 STRL LF DISP TIS (BLADE) ×1 IMPLANT
BLADE SURG 15 STRL SS (BLADE) ×3
BNDG GAUZE ELAST 4 BULKY (GAUZE/BANDAGES/DRESSINGS) IMPLANT
COVER SURGICAL LIGHT HANDLE (MISCELLANEOUS) ×6 IMPLANT
DECANTER SPIKE VIAL GLASS SM (MISCELLANEOUS) IMPLANT
DRAPE LAPAROSCOPIC ABDOMINAL (DRAPES) IMPLANT
DRAPE LAPAROTOMY T 98X78 PEDS (DRAPES) ×2 IMPLANT
DRSG PAD ABDOMINAL 8X10 ST (GAUZE/BANDAGES/DRESSINGS) IMPLANT
ELECT PENCIL ROCKER SW 15FT (MISCELLANEOUS) ×3 IMPLANT
ELECT REM PT RETURN 9FT ADLT (ELECTROSURGICAL) ×3
ELECTRODE REM PT RTRN 9FT ADLT (ELECTROSURGICAL) ×1 IMPLANT
GAUZE PACKING 1/2X5YD (GAUZE/BANDAGES/DRESSINGS) ×2 IMPLANT
GAUZE SPONGE 4X4 12PLY STRL (GAUZE/BANDAGES/DRESSINGS) ×2 IMPLANT
GLOVE BIO SURGEON STRL SZ7.5 (GLOVE) ×3 IMPLANT
GOWN STRL REUS W/TWL LRG LVL3 (GOWN DISPOSABLE) ×6 IMPLANT
KIT BASIN OR (CUSTOM PROCEDURE TRAY) ×3 IMPLANT
NDL HYPO 25X1 1.5 SAFETY (NEEDLE) IMPLANT
NEEDLE HYPO 25X1 1.5 SAFETY (NEEDLE) ×3 IMPLANT
NS IRRIG 1000ML POUR BTL (IV SOLUTION) ×3 IMPLANT
PAD ABD 8X10 STRL (GAUZE/BANDAGES/DRESSINGS) ×2 IMPLANT
SPONGE LAP 18X18 X RAY DECT (DISPOSABLE) ×3 IMPLANT
SUT MNCRL AB 4-0 PS2 18 (SUTURE) IMPLANT
SUT VIC AB 3-0 SH 27 (SUTURE)
SUT VIC AB 3-0 SH 27XBRD (SUTURE) IMPLANT
SWAB COLLECTION DEVICE MRSA (MISCELLANEOUS) IMPLANT
SWAB CULTURE ESWAB REG 1ML (MISCELLANEOUS) IMPLANT
SYR BULB 3OZ (MISCELLANEOUS) ×3 IMPLANT
SYR CONTROL 10ML LL (SYRINGE) IMPLANT
TAPE CLOTH SURG 4X10 WHT LF (GAUZE/BANDAGES/DRESSINGS) ×2 IMPLANT
TOWEL OR 17X26 10 PK STRL BLUE (TOWEL DISPOSABLE) ×3 IMPLANT
YANKAUER SUCT BULB TIP NO VENT (SUCTIONS) ×3 IMPLANT

## 2015-02-23 NOTE — ED Notes (Signed)
Nurse drawing labs. 

## 2015-02-23 NOTE — ED Notes (Signed)
Writer was informed by RN that pt is also refusing blood work.

## 2015-02-23 NOTE — Anesthesia Postprocedure Evaluation (Signed)
Anesthesia Post Note  Patient: Yolanda Richard  Procedure(s) Performed: Procedure(s) (LRB): INCISION AND DRAINAGE ABSCESS (Left)  Patient location during evaluation: PACU Anesthesia Type: General Level of consciousness: awake and alert Pain management: pain level controlled Vital Signs Assessment: post-procedure vital signs reviewed and stable Respiratory status: spontaneous breathing, nonlabored ventilation, respiratory function stable and patient connected to nasal cannula oxygen Cardiovascular status: blood pressure returned to baseline and stable Postop Assessment: no signs of nausea or vomiting Anesthetic complications: no    Last Vitals:  Filed Vitals:   02/23/15 1045 02/23/15 1105  BP: 163/110 175/110  Pulse: 71 65  Temp: 36.6 C 36.7 C  Resp: 14 16    Last Pain:  Filed Vitals:   02/23/15 1109  PainSc: 5                  Adelaine Roppolo J

## 2015-02-23 NOTE — ED Provider Notes (Signed)
CSN: [id number]     Arrival date & time 02/22/15  2245 History   First MD Initiated Contact with Patient 02/23/15 0116     Chief Complaint  Patient presents with  . Abscess     (Consider location/radiation/quality/duration/timing/severity/associated sxs/prior Treatment) Patient is a 45 y.o. female presenting with abscess. The history is provided by the patient. No language interpreter was used.  Abscess Location:  Shoulder/arm Shoulder/arm abscess location:  L axilla Abscess quality: painful   Abscess quality: not draining   Red streaking: no   Duration:  2 days Progression:  Worsening Associated symptoms: nausea   Associated symptoms: no fever and no vomiting   Associated symptoms comment:  Patient with a history of hidradenitis suppurativa with previous surgical excision presents with similar symptoms of painful swelling in the left axilla for 2 days. No known fever but she reports hot/cold chills. She denies vomiting but has had nausea. No drainage from the area that the patient has noticed.    Past Medical History  Diagnosis Date  . Preterm labor   . Hypertension   . Trichomonas   . Hidradenitis suppurativa    Past Surgical History  Procedure Laterality Date  . Abdominal hysterectomy    . Mouth surgery    . Hydradenitis excision Right 12/07/2013    Procedure: EXCISION HIDRADENITIS AXILLA right axilla abscess ;  Surgeon: Emelia Loron, MD;  Location: [facility];  Service: General;  Laterality: Right;   Family History  Problem Relation Age of Onset  . Other Neg Hx   . Cancer Mother   . Cancer Maternal Aunt   . Stroke Paternal Uncle   . Diabetes Paternal Uncle   . Cancer Maternal Grandmother   . Cancer Paternal Grandmother    Social History  Substance Use Topics  . Smoking status: Current Every Day Smoker -- 0.75 packs/day for 16 years    Types: Cigarettes  . Smokeless tobacco: Never Used  . Alcohol Use: Yes     Comment: occasional   OB History    Gravida Para  Term Preterm AB TAB SAB Ectopic Multiple Living   Review of Systems  Constitutional: Negative for fever and chills.  Respiratory: Negative.  Negative for shortness of breath.   Cardiovascular: Negative.   Gastrointestinal: Positive for nausea. Negative for vomiting and abdominal pain.  Musculoskeletal: Negative.  Negative for myalgias.  Skin: Positive for color change.       Erythema  Neurological: Negative.       Allergies  Ace inhibitors and Flagyl  Home Medications   Prior to Admission medications   Medication Sig Start Date End Date Taking? Authorizing Provider  doxycycline (VIBRA-TABS) 100 MG tablet Take 1 tablet (100 mg total) by mouth 2 (two) times daily. Patient not taking: Reported on 02/23/2015 06/12/14   Eulis Foster, FNP  hydrochlorothiazide (MICROZIDE) 12.5 MG capsule Take 1 capsule (12.5 mg total) by mouth daily. Patient not taking: Reported on 02/23/2015 03/20/14   Eulis Foster, FNP  oxyCODONE (OXY IR/ROXICODONE) 5 MG immediate release tablet Take 1-2 tablets (5-10 mg total) by mouth every 4 (four) hours as needed for moderate pain. Patient not taking: Reported on 02/23/2015 06/12/14   Eulis Foster, FNP  valACYclovir (VALTREX) 1000 MG tablet Take 1 tablet (1,000 mg total) by mouth as needed. Patient not taking: Reported on 02/23/2015 08/01/14   Eulis Foster, FNP   BP 142/92 mmHg  Pulse 104  Temp(Src) 99.9 F (37.7 C) (Oral)  Resp 18  Ht  (1.6 m)  Wt 74.844 kg  BMI 29.24 kg/m2  SpO2 98% Physical Exam  Constitutional: She is oriented to person, place, and time. She appears well-developed and well-nourished.  HENT:  Head: Normocephalic.  Neck: Normal range of motion. Neck supple.  Cardiovascular: Normal rate.   Pulmonary/Chest: Effort normal and breath sounds normal.  Abdominal: Soft. Bowel sounds are normal. There is no tenderness. There is no rebound and no guarding.  Musculoskeletal: Normal range of motion.  Neurological: She  is alert and oriented to person, place, and time.  Skin: Skin is warm and dry. No rash noted.  Examination is limited by patient being unable to expose the axilla due to pain.  There is significant swelling in central axilla. No drainage noted.   Psychiatric: She has a normal mood and affect.    ED Course  Procedures (including critical care time) Labs Review Labs Reviewed  CBC WITH DIFFERENTIAL/PLATELET  BASIC METABOLIC PANEL  LACTIC ACID, PLASMA  LACTIC ACID, PLASMA   Results for orders placed or performed during the hospital encounter of 02/22/15  CBC with Differential  Result Value Ref Range   WBC 11.8 (H) 4.0 - 10.5 K/uL   RBC 4.51 3.87 - 5.11 MIL/uL   Hemoglobin 13.6 12.0 - 15.0 g/dL   HCT 16.1 09.6 - 04.5 %   MCV 88.9 78.0 - 100.0 fL   MCH 30.2 26.0 - 34.0 pg   MCHC 33.9 30.0 - 36.0 g/dL   RDW 40.9 81.1 - 91.4 %   Platelets 158 150 - 400 K/uL   Neutrophils Relative % 69 %   Neutro Abs 8.0 (H) 1.7 - 7.7 K/uL   Lymphocytes Relative 24 %   Lymphs Abs 2.9 0.7 - 4.0 K/uL   Monocytes Relative 6 %   Monocytes Absolute 0.7 0.1 - 1.0 K/uL   Eosinophils Relative 1 %   Eosinophils Absolute 0.2 0.0 - 0.7 K/uL   Basophils Relative 0 %   Basophils Absolute 0.0 0.0 - 0.1 K/uL  Basic metabolic panel  Result Value Ref Range   Sodium 140 135 - 145 mmol/L   Potassium 3.9 3.5 - 5.1 mmol/L   Chloride 107 101 - 111 mmol/L   CO2 23 22 - 32 mmol/L   Glucose, Bld 102 (H) 65 - 99 mg/dL   BUN 8 6 - 20 mg/dL   Creatinine, Ser 7.82 0.44 - 1.00 mg/dL   Calcium 9.0 8.9 - 95.6 mg/dL   GFR calc non Af Amer >60 >60 mL/min   GFR calc Af Amer >60 >60 mL/min   Anion gap 10 5 - 15  Lactic acid, plasma  Result Value Ref Range   Lactic Acid, Venous 0.7 0.5 - 2.0 mmol/L     Imaging Review No results found. I have personally reviewed and evaluated these images and lab results as part of my medical decision-making.   EKG Interpretation None      MDM   Final diagnoses:  None    1.  Left axillary abscess 2. History of hidradenitis  The patient advises that she usually gets admitted for surgical procedure for her hidradenitis. Chart reviewed. Admitted 12/2013 by Dr. Dwain Sarna for right axillary excision.   The patient's pain is improved with IV Dilaudid. She is resting comfortably. She has low grade temp, other VS normal. No sign of sepsis, or extended cellulitis. The patient reports she prefers general anesthesia as she  cannot tolerate local. The patient is insistent on needing surgical intervention. Will contact surgery at patient's request.   Discussed the patient's condition Dr. Abbey Chatters who will see the patient in consultation in the emergency department.   Elpidio Anis, PA-C 02/23/15 0559  Dione Booze, MD 02/23/15 [phone]

## 2015-02-23 NOTE — Discharge Instructions (Addendum)
We will arrange for home health nurses to help you do saline damp to dry dressing changes beginning on Monday.  If you have not heard anything about this by 11:00 am Monday morning, please call the office and inquire about it.  Take antibiotics and pain medicine as directed.  Take an over the counter probiotic daily while taking antibiotics.  Call for heavy bleeding or wound problems.  No heavy lifting or straining with the left arm for one week.  May return to work 03/04/2015.  Please call our office and arranged to be seen in approximately 2 weeks for a wound check. [phone].

## 2015-02-23 NOTE — Anesthesia Preprocedure Evaluation (Signed)
Anesthesia Evaluation  Patient identified by MRN, date of birth, ID band Patient awake    Reviewed: Allergy & Precautions, NPO status , Patient's Chart, lab work & pertinent test results  Airway Mallampati: II  TM Distance: >3 FB Neck ROM: Full    Dental no notable dental hx.    Pulmonary asthma , Current Smoker,    Pulmonary exam normal breath sounds clear to auscultation       Cardiovascular Exercise Tolerance: Good hypertension, Pt. on medications Normal cardiovascular exam Rhythm:Regular Rate:Normal     Neuro/Psych  Headaches, PSYCHIATRIC DISORDERS    GI/Hepatic negative GI ROS, Neg liver ROS,   Endo/Other  negative endocrine ROS  Renal/GU negative Renal ROS  negative genitourinary   Musculoskeletal negative musculoskeletal ROS (+)   Abdominal   Peds negative pediatric ROS (+)  Hematology  (+) anemia ,   Anesthesia Other Findings   Reproductive/Obstetrics negative OB ROS                             Anesthesia Physical Anesthesia Plan  ASA: II  Anesthesia Plan: General   Post-op Pain Management:    Induction: Intravenous  Airway Management Planned: LMA  Additional Equipment:   Intra-op Plan:   Post-operative Plan: Extubation in OR  Informed Consent: I have reviewed the patients History and Physical, chart, labs and discussed the procedure including the risks, benefits and alternatives for the proposed anesthesia with the patient or authorized representative who has indicated his/her understanding and acceptance.   Dental advisory given  Plan Discussed with: CRNA  Anesthesia Plan Comments:         Anesthesia Quick Evaluation

## 2015-02-23 NOTE — H&P (Signed)
Yolanda Richard is an 45 y.o. female.   Chief Complaint:   Painful left axillary abscess. HPI:   Yolanda Richard to the emergency department last night complaining of severe swelling and pain in the left axillary area. She stated she first noted some discomfort about 9 days ago then it abruptly increased recently along with the swelling. She has a history of hidradenitis and in November 2015 had a right axillary abscess drained in the operating room by Dr. Donne Hazel. She states she has had some fever and has loss of appetite.  Of note was that she presented to the emergency department approximately 9 days ago with some left-sided chest pain. She thinks that is when this process started. She did not stay for evaluation because the wait was too long.  Past Medical History  Diagnosis Date  . Preterm labor   . Hypertension   . Trichomonas   . Hidradenitis suppurativa     Past Surgical History  Procedure Laterality Date  . Abdominal hysterectomy    . Mouth surgery    . Hydradenitis excision Right 12/07/2013    Procedure: EXCISION HIDRADENITIS AXILLA right axilla abscess ;  Surgeon: Yolanda Bookbinder, MD;  Location: [facility];  Service: General;  Laterality: Right;    Family History  Problem Relation Age of Onset  . Other Neg Hx   . Cancer Mother   . Cancer Maternal Aunt   . Stroke Paternal Uncle   . Diabetes Paternal Uncle   . Cancer Maternal Grandmother   . Cancer Paternal Grandmother    Social History:  reports that she has been smoking Cigarettes.  She has a 12 pack-year smoking history. She has never used smokeless tobacco. She reports that she drinks alcohol. She reports that she does not use illicit drugs.  Allergies:  Allergies  Allergen Reactions  . Ace Inhibitors Swelling    angioedema  . Flagyl [Metronidazole] Nausea And Vomiting   Prior to Admission medications   Medication Sig Start Date End Date Taking? Authorizing Provider  doxycycline (VIBRA-TABS) 100 MG tablet Take 1  tablet (100 mg total) by mouth 2 (two) times daily. Patient not taking: Reported on 02/23/2015 06/12/14   Kennyth Arnold, FNP  hydrochlorothiazide (MICROZIDE) 12.5 MG capsule Take 1 capsule (12.5 mg total) by mouth daily. Patient not taking: Reported on 02/23/2015 03/20/14   Kennyth Arnold, FNP  oxyCODONE (OXY IR/ROXICODONE) 5 MG immediate release tablet Take 1-2 tablets (5-10 mg total) by mouth every 4 (four) hours as needed for moderate pain. Patient not taking: Reported on 02/23/2015 06/12/14   Kennyth Arnold, FNP  valACYclovir (VALTREX) 1000 MG tablet Take 1 tablet (1,000 mg total) by mouth as needed. Patient not taking: Reported on 02/23/2015 08/01/14   Kennyth Arnold, FNP      (Not in a hospital admission)  Results for orders placed or performed during the hospital encounter of 02/22/15 (from the past 48 hour(s))  CBC with Differential     Status: Abnormal   Collection Time: 02/23/15  1:43 AM  Result Value Ref Range   WBC 11.8 (H) 4.0 - 10.5 K/uL   RBC 4.51 3.87 - 5.11 MIL/uL   Hemoglobin 13.6 12.0 - 15.0 g/dL   HCT 40.1 36.0 - 46.0 %   MCV 88.9 78.0 - 100.0 fL   MCH 30.2 26.0 - 34.0 pg   MCHC 33.9 30.0 - 36.0 g/dL   RDW 13.9 11.5 - 15.5 %   Platelets 158 150 - 400 K/uL   Neutrophils  Relative % 69 %   Neutro Abs 8.0 (H) 1.7 - 7.7 K/uL   Lymphocytes Relative 24 %   Lymphs Abs 2.9 0.7 - 4.0 K/uL   Monocytes Relative 6 %   Monocytes Absolute 0.7 0.1 - 1.0 K/uL   Eosinophils Relative 1 %   Eosinophils Absolute 0.2 0.0 - 0.7 K/uL   Basophils Relative 0 %   Basophils Absolute 0.0 0.0 - 0.1 K/uL  Basic metabolic panel     Status: Abnormal   Collection Time: 02/23/15  3:57 AM  Result Value Ref Range   Sodium 140 135 - 145 mmol/L   Potassium 3.9 3.5 - 5.1 mmol/L   Chloride 107 101 - 111 mmol/L   CO2 23 22 - 32 mmol/L   Glucose, Bld 102 (H) 65 - 99 mg/dL   BUN 8 6 - 20 mg/dL   Creatinine, Ser 0.83 0.44 - 1.00 mg/dL   Calcium 9.0 8.9 - 10.3 mg/dL   GFR calc non Af Amer >60 >60  mL/min   GFR calc Af Amer >60 >60 mL/min    Comment: (NOTE) The eGFR has been calculated using the CKD EPI equation. This calculation has not been validated in all clinical situations. eGFR's persistently <60 mL/min signify possible Chronic Kidney Disease.    Anion gap 10 5 - 15  Lactic acid, plasma     Status: None   Collection Time: 02/23/15  3:57 AM  Result Value Ref Range   Lactic Acid, Venous 0.7 0.5 - 2.0 mmol/L   No results found.  Review of Systems  Constitutional: Positive for fever.  Respiratory: Negative for shortness of breath.   Gastrointestinal: Positive for diarrhea. Negative for nausea and abdominal pain.  Genitourinary: Negative for dysuria.    Blood pressure 150/98, pulse 95, temperature 99.6 F (37.6 C), temperature source Oral, resp. rate 17, height 5' 3" (1.6 m), weight 74.844 kg (165 lb), SpO2 96 %. Physical Exam  Constitutional: She appears well-developed and well-nourished. No distress.  HENT:  Head: Normocephalic and atraumatic.  Cardiovascular: Normal rate and regular rhythm.   Respiratory: Effort normal and breath sounds normal.  GI: Soft. There is no tenderness.  Musculoskeletal:  7 cm fluctuant, erythematous area left axilla. Right axillary scar present.  Neurological: She is alert.  Skin: Skin is warm and dry.  Psychiatric: She has a normal mood and affect. Her behavior is normal.     Assessment/Plan Large left axillary abscess and the patient with a history of hidradenitis.  Plan: Dose of IV antibiotics, we'll then take her to the operating room for incision and drainage of left axillary abscess. We have discussed the procedure, risks, and aftercare. Risks include but are not limited to bleeding, recurrent infection, wound healing problems, anesthesia, cosmetic deformity. We also talked about the importance of her stopping smoking during the healing process. She seems to understand all this and agrees with the plan.  ROSENBOWER,TODD  J 02/23/2015, 7:25 AM

## 2015-02-23 NOTE — Op Note (Signed)
Operative Note  Zeppelin Beckstrand female 45 y.o. 02/23/2015  PREOPERATIVE DX:  Large left axillary abscess  POSTOPERATIVE DX:  Same  PROCEDURE:   Complex incision and drainage of large left axillary abscess         Surgeon: Adolph Pollack   Assistants: None  Anesthesia: General LMA anesthesia  Indications:   This is a 45 year old female who has developed a large left axillary abscess. She now presents for incision and drainage.    Procedure Detail:  She was seen in the holding area in the left axillary area marked with my initials. She was brought to the operating room placed supine on the operating table and a general anesthetic was given. The left axillary area was sterilely prepped and draped. A timeout was performed.  An elliptical incision was made through the fluctuant area to include subcutaneous tissue in this scan is some retained tissue were removed. A large amount of pus was drained. The abscess went all the way down to the fascia. I inspected the area and there were no loculations present. I then controlled bleeding with electrocautery. I then anesthetized the wound with half percent Marcaine with epinephrine.  Once hemostasis was adequate, the wound was packed with saline moistened gauze followed by a bulky dry dressing.  She tolerated the procedure well without any apparent complications and was taken to the recovery room in satisfactory condition.   Estimated Blood Loss:  100 ml

## 2015-02-23 NOTE — ED Notes (Signed)
Bed: WA03 Expected date:  Expected time:  Means of arrival:  Comments: 

## 2015-02-23 NOTE — Anesthesia Procedure Notes (Signed)
Procedure Name: LMA Insertion Date/Time: 02/23/2015 9:29 AM Performed by: Paris Lore Pre-anesthesia Checklist: Patient identified, Emergency Drugs available, Suction available, Patient being monitored and Timeout performed Patient Re-evaluated:Patient Re-evaluated prior to inductionOxygen Delivery Method: Circle system utilized Preoxygenation: Pre-oxygenation with 100% oxygen Intubation Type: IV induction Ventilation: Mask ventilation without difficulty LMA: LMA inserted LMA Size: 4.0 Number of attempts: 1 Placement Confirmation: positive ETCO2 and breath sounds checked- equal and bilateral Tube secured with: Tape

## 2015-02-23 NOTE — ED Provider Notes (Signed)
Care signed over to me at shift change from [facility]. Pt here with hydradenitis, not tolerating even an exam of the axilla affected due to tremendous pain. Has had surgical procedures in the past for this. Dr. Abbey Chatters was consulted and will be coming to see pt. Will await further recommendations from him.   BP 150/98 mmHg  Pulse 95  Temp(Src) 99.6 F (37.6 C) (Oral)  Resp 17  Ht  (1.6 m)  Wt 74.844 kg  BMI 29.24 kg/m2  SpO2 96%  Gen: low-grade temp 99.20F, otherwise VSS, NAD HEENT: EOMI, MMM Resp: no resp distress CV: rate WNL Abd: appearance normal, nondistended MsK: not allowing movement of L arm due to pain in axilla, unable to visualize axilla due to discomfort Neuro: A&O x4  Results for orders placed or performed during the hospital encounter of 02/22/15  CBC with Differential  Result Value Ref Range   WBC 11.8 (H) 4.0 - 10.5 K/uL   RBC 4.51 3.87 - 5.11 MIL/uL   Hemoglobin 13.6 12.0 - 15.0 g/dL   HCT 16.1 09.6 - 04.5 %   MCV 88.9 78.0 - 100.0 fL   MCH 30.2 26.0 - 34.0 pg   MCHC 33.9 30.0 - 36.0 g/dL   RDW 40.9 81.1 - 91.4 %   Platelets 158 150 - 400 K/uL   Neutrophils Relative % 69 %   Neutro Abs 8.0 (H) 1.7 - 7.7 K/uL   Lymphocytes Relative 24 %   Lymphs Abs 2.9 0.7 - 4.0 K/uL   Monocytes Relative 6 %   Monocytes Absolute 0.7 0.1 - 1.0 K/uL   Eosinophils Relative 1 %   Eosinophils Absolute 0.2 0.0 - 0.7 K/uL   Basophils Relative 0 %   Basophils Absolute 0.0 0.0 - 0.1 K/uL  Basic metabolic panel  Result Value Ref Range   Sodium 140 135 - 145 mmol/L   Potassium 3.9 3.5 - 5.1 mmol/L   Chloride 107 101 - 111 mmol/L   CO2 23 22 - 32 mmol/L   Glucose, Bld 102 (H) 65 - 99 mg/dL   BUN 8 6 - 20 mg/dL   Creatinine, Ser 7.82 0.44 - 1.00 mg/dL   Calcium 9.0 8.9 - 95.6 mg/dL   GFR calc non Af Amer >60 >60 mL/min   GFR calc Af Amer >60 >60 mL/min   Anion gap 10 5 - 15  Lactic acid, plasma  Result Value Ref Range   Lactic Acid, Venous 0.7 0.5 - 2.0  mmol/L    Meds ordered this encounter  Medications  . morphine 4 MG/ML injection 4 mg    Sig:   . HYDROmorphone (DILAUDID) injection 1 mg    Sig:   . ondansetron (ZOFRAN) injection 4 mg    Sig:   . diphenhydrAMINE (BENADRYL) injection 12.5 mg    Sig:   . piperacillin-tazobactam (ZOSYN) IVPB 3.375 g    Sig:     Order Specific Question:  Antibiotic Indication:    Answer:  Other Indication (list below)    Order Specific Question:  Other Indication:    Answer:  complex abscess     MDM:   ICD-9-CM ICD-10-CM   1. Hidradenitis suppurativa of left axilla 705.83 L73.2    7:09 AM- checked on pt, no needs at this time. Awaiting Dr. Abbey Chatters. Of note, mild leukocytosis on labs with low-grade temp does make it concerning for possible early sepsis/SIRS although lactic acid WNL. Will continue to monitor.  7:54 AM Dr. Abbey Chatters in to  see pt, H&P states they will be taking her to the OR and admitting her. Please see his notes for further documentation of care.  Verner Kopischke Camprubi-Soms, PA-C 02/23/15 0754  Dione Booze, MD 02/23/15 [phone]

## 2015-02-23 NOTE — ED Notes (Signed)
Pt declined IV access in the middle of 2nd attempt

## 2015-02-23 NOTE — Transfer of Care (Signed)
Immediate Anesthesia Transfer of Care Note  Patient: Yolanda Richard  Procedure(s) Performed: Procedure(s): INCISION AND DRAINAGE ABSCESS (Left)  Patient Location: PACU  Anesthesia Type:General  Level of Consciousness:  sedated, patient cooperative and responds to stimulation  Airway & Oxygen Therapy:Patient Spontanous Breathing and Patient connected to face mask oxgen  Post-op Assessment:  Report given to PACU RN and Post -op Vital signs reviewed and stable  Post vital signs:  Reviewed and stable  Last Vitals:  Filed Vitals:   02/23/15 0512 02/23/15 0742  BP: 150/98 154/109  Pulse: 95 87  Temp:  36.9 C  Resp: 17 14    Complications: No apparent anesthesia complications

## 2015-02-24 MED ORDER — AMOXICILLIN-POT CLAVULANATE 875-125 MG PO TABS: 1.0000 | ORAL_TABLET | Freq: Two times a day (BID) | ORAL | Status: DC

## 2015-02-24 MED ORDER — OXYCODONE HCL 5 MG PO TABS: 5.0000 mg | ORAL_TABLET | ORAL | Status: DC | PRN

## 2015-02-24 NOTE — Progress Notes (Signed)
Assessment unchanged. Pt verbalized understanding of dc instructions through teach back regarding follow up care, home health as arranged by [facility] office and when to call the doctor. Scripts x 2 given as provided by MD.  Awaiting on daughter to come pick up but pt wants to leave now with car keys in hand. Advised pt to wait for daughter yet adamant to leave anyway. AC notified who came to unit as pt was leaving. Both AC and nursing again advised pt to wait for ride for safety purposes. Last narcotic received 4 hours ago. Pt alert and independent. Pt acknowledged advisement of staff to wait yet remained adamant to go. AC escorted pt to first floor entrance.

## 2015-02-24 NOTE — Discharge Summary (Signed)
Physician Discharge Summary  Patient ID: Yolanda Richard MRN: [id number] DOB/AGE: [birth year] 45 y.o.  Admit date: 02/22/2015 Discharge date: 02/24/2015  Admission Diagnoses:  Large left axillary abscess  Discharge Diagnoses:  Principal Problem:   Axillary abscess, left s/p complex incision and drainage 02/23/15   Discharged Condition: good  Hospital Course: She underwent the above procedure and did well.  She was kept on IV abxs.  She was able to be discharged POD #1 on oral abxs.  Will arrange for [facility] to start dressing changes on 02/25/15.  Discharge instructions were given to her.   Discharge Exam: Blood pressure 158/90, pulse 81, temperature 98.3 F (36.8 C), temperature source Oral, resp. rate 18, height  (1.6 m), weight 74.844 kg (165 lb), SpO2 96 %.   Disposition: 07-Left Against Medical Advice     Medication List    STOP taking these medications        doxycycline 100 MG tablet  Commonly known as:  VIBRA-TABS  Replaced by:  doxycycline 50 MG capsule      TAKE these medications        amoxicillin-clavulanate 875-125 MG tablet  Commonly known as:  AUGMENTIN  Take 1 tablet by mouth 2 (two) times daily.     doxycycline 50 MG capsule  Commonly known as:  VIBRAMYCIN  Take 2 capsules (100 mg total) by mouth 2 (two) times daily.     hydrochlorothiazide 12.5 MG capsule  Commonly known as:  MICROZIDE  Take 1 capsule (12.5 mg total) by mouth daily.     oxyCODONE 5 MG immediate release tablet  Commonly known as:  Oxy IR/ROXICODONE  Take 1-2 tablets (5-10 mg total) by mouth every 4 (four) hours as needed for moderate pain, severe pain or breakthrough pain.     oxyCODONE 5 MG immediate release tablet  Commonly known as:  Oxy IR/ROXICODONE  Take 1-2 tablets (5-10 mg total) by mouth every 4 (four) hours as needed for moderate pain.     valACYclovir 1000 MG tablet  Commonly known as:  VALTREX  Take 1 tablet (1,000 mg total) by mouth as needed.          Signed: Adolph Pollack 02/24/2015, 10:04 AM

## 2015-02-24 NOTE — Progress Notes (Signed)
1 Day Post-Op  Subjective: Feels much better.  Objective: Vital signs in last 24 hours: Temp:  [97.7 F (36.5 C)-99.9 F (37.7 C)] 98.3 F (36.8 C) (01/22 0620) Pulse Rate:  [65-93] 81 (01/22 0620) Resp:  [13-20] 18 (01/22 0620) BP: (136-175)/(76-113) 158/90 mmHg (01/22 0620) SpO2:  [96 %-100 %] 96 % (01/22 0620) Last BM Date: 02/22/15  Intake/Output from previous day: 01/21 0701 - 01/22 0700 In: 1440 [P.O.:840; I.V.:500; IV Piggyback:100] Out: 1775 [Urine:1775] Intake/Output this shift:    PE: General- In NAD Left axilla-packing in, no bleeding  Lab Results:   Recent Labs  02/23/15 0143  WBC 11.8*  HGB 13.6  HCT 40.1  PLT 158   BMET  Recent Labs  02/23/15 0357  NA 140  K 3.9  CL 107  CO2 23  GLUCOSE 102*  BUN 8  CREATININE 0.83  CALCIUM 9.0   PT/INR No results for input(s): LABPROT, INR in the last 72 hours. Comprehensive Metabolic Panel:    Component Value Date/Time   NA 140 02/23/2015 0357   NA 138 02/14/2015 1354   K 3.9 02/23/2015 0357   K 4.2 02/14/2015 1354   CL 107 02/23/2015 0357   CL 106 02/14/2015 1354   CO2 23 02/23/2015 0357   CO2 22 02/14/2015 1354   BUN 8 02/23/2015 0357   BUN 8 02/14/2015 1354   CREATININE 0.83 02/23/2015 0357   CREATININE 0.77 02/14/2015 1354   GLUCOSE 102* 02/23/2015 0357   GLUCOSE 112* 02/14/2015 1354   CALCIUM 9.0 02/23/2015 0357   CALCIUM 9.2 02/14/2015 1354   AST 14 02/24/2013 0930   AST 16 02/14/2013 1630   ALT 14 02/24/2013 0930   ALT 13 02/14/2013 1630   ALKPHOS 55 02/24/2013 0930   ALKPHOS 58 02/14/2013 1630   BILITOT 0.4 02/24/2013 0930   BILITOT 0.5 02/14/2013 1630   PROT 7.2 02/24/2013 0930   PROT 7.7 02/14/2013 1630   ALBUMIN 3.8 02/24/2013 0930   ALBUMIN 4.2 02/14/2013 1630     Studies/Results: No results found.  Anti-infectives: Anti-infectives    Start     Dose/Rate Route Frequency Ordered Stop   02/23/15 1600  piperacillin-tazobactam (ZOSYN) IVPB 3.375 g     3.375 g 12.5  mL/hr over 240 Minutes Intravenous Every 8 hours 02/23/15 1128     02/23/15 0745  piperacillin-tazobactam (ZOSYN) IVPB 3.375 g     3.375 g 12.5 mL/hr over 240 Minutes Intravenous  Once 02/23/15 0732 02/23/15 1230   02/23/15 0000  doxycycline (VIBRAMYCIN) 50 MG capsule     100 mg Oral 2 times daily 02/23/15 1005        Assessment Principal Problem:   Axillary abscess, left s/p complex incision and drainage 02/23/15-she feels much better.      Plan: Discharge today.  Will have home health nurses start daily saline wet to dry dressing changes tomorrow.  Oral abxs.  RTC in 1-2 weeks.   [name] J 02/24/2015

## 2015-02-25 ENCOUNTER — Encounter (HOSPITAL_COMMUNITY): Payer: Self-pay | Admitting: General Surgery

## 2015-02-28 ENCOUNTER — Ambulatory Visit (INDEPENDENT_AMBULATORY_CARE_PROVIDER_SITE_OTHER): Payer: Managed Care, Other (non HMO) | Admitting: Family Medicine

## 2015-02-28 ENCOUNTER — Encounter: Payer: Self-pay | Admitting: Family Medicine

## 2015-02-28 VITALS — BP 138/98 | Temp 98.9°F | Wt 154.6 lb

## 2015-02-28 DIAGNOSIS — Z9889 Other specified postprocedural states: Secondary | ICD-10-CM

## 2015-02-28 DIAGNOSIS — L02419 Cutaneous abscess of limb, unspecified: Secondary | ICD-10-CM | POA: Diagnosis not present

## 2015-02-28 NOTE — Patient Instructions (Addendum)
Continue with damp to dry saline dressing as directed by [facility]. Medication for pain can be used prior to dressing change and as needed. If you notice drainage or pain increases or if you develop a fever >101, please contact office for further evaluation. Also, please consider evaluation and follow up treatment with dermatology. Consider monitoring BP at home daily and if BP remains >140/90 after medication, please contact office for further evaluation.  Hidradenitis Suppurativa Hidradenitis suppurativa is a long-term (chronic) skin disease that starts with blocked sweat glands or hair follicles. Bacteria may grow in these blocked openings of your skin. Hidradenitis suppurativa is like a severe form of acne that develops in areas of your body where acne would be unusual. It is most likely to affect the areas of your body where skin rubs against skin and becomes moist. This includes your:  Underarms.  Groin.  Genital areas.  Buttocks.  Upper thighs.  Breasts. Hidradenitis suppurativa may start out with small pimples. The pimples can develop into deep sores that break open (rupture) and drain pus. Over time your skin may thicken and become scarred. Hidradenitis suppurativa cannot be passed from person to person.  CAUSES  The exact cause of hidradenitis suppurativa is not known. This condition may be due to:  Female and female hormones. The condition is rare before and after puberty.  An overactive body defense system (immune system). Your immune system may overreact to the blocked hair follicles or sweat glands and cause swelling and pus-filled sores. RISK FACTORS You may have a higher risk of hidradenitis suppurativa if you:  Are a woman.  Are between ages 30 and 4.  Have a family history of hidradenitis suppurativa.  Have a personal history of acne.  Are overweight.  Smoke.  Take the drug lithium. SIGNS AND SYMPTOMS  The first signs of an outbreak are usually  painful skin bumps that look like pimples. As the condition progresses:  Skin bumps may get bigger and grow deeper into the skin.  Bumps under the skin may rupture and drain smelly pus.  Skin may become itchy and infected.  Skin may thicken and scar.  Drainage may continue through tunnels under the skin (fistulas).  Walking and moving your arms can become painful. DIAGNOSIS  Your health care provider may diagnose hidradenitis suppurativa based on your medical history and your signs and symptoms. A physical exam will also be done. You may need to see a health care provider who specializes in skin diseases (dermatologist). You may also have tests done to confirm the diagnosis. These can include:  Swabbing a sample of pus or drainage from your skin so it can be sent to the lab and tested for infection.  Blood tests to check for infection. TREATMENT  The same treatment will not work for everybody with hidradenitis suppurativa. Your treatment will depend on how severe your symptoms are. You may need to try several treatments to find what works best for you. Part of your treatment may include cleaning and bandaging (dressing) your wounds. You may also have to take medicines, such as the following:  Antibiotics.  Acne medicines.  Medicines to block or suppress the immune system.  A diabetes medicine (metformin) is sometimes used to treat this condition.  For women, birth control pills can sometimes help relieve symptoms. You may need surgery if you have a severe case of hidradenitis suppurativa that does not respond to medicine. Surgery may involve:   Using a laser to clear the  skin and remove hair follicles.  Opening and draining deep sores.  Removing the areas of skin that are diseased and scarred. HOME CARE INSTRUCTIONS  Learn as much as you can about your disease, and work closely with your health care providers.  Take medicines only as directed by your health care  provider.  If you were prescribed an antibiotic medicine, finish it all even if you start to feel better.  If you are overweight, losing weight may be very helpful. Try to reach and maintain a healthy weight.  Do not use any tobacco products, including cigarettes, chewing tobacco, or electronic cigarettes. If you need help quitting, ask your health care provider.  Do not shave the areas where you get hidradenitis suppurativa.  Do not wear deodorant.  Wear loose-fitting clothes.  Try not to overheat and get sweaty.  Take a daily bleach bath as directed by your health care provider.  Fill your bathtub halfway with water.  Pour in  cup of unscented household bleach.  Soak for 5-10 minutes.  Cover sore areas with a warm, clean washcloth (compress) for 5-10 minutes. SEEK MEDICAL CARE IF:   You have a flare-up of hidradenitis suppurativa.  You have chills or a fever.  You are having trouble controlling your symptoms at home.   This information is not intended to replace advice given to you by your health care provider. Make sure you discuss any questions you have with your health care provider.   Document Released: 09/03/2003 Document Revised: 02/09/2014 Document Reviewed: 04/21/2013 Elsevier Interactive Patient Education 2016 [employer].  Consider monitoring BP at home one/day for 1 week. If BP remains above 140/90, please contact clinic for further evaluation.

## 2015-02-28 NOTE — Progress Notes (Signed)
Pre visit review using our clinic review tool, if applicable. No additional management support is needed unless otherwise documented below in the visit note. 

## 2015-02-28 NOTE — Progress Notes (Signed)
Subjective:    Patient ID: Yolanda Richard, female    DOB: [birth year], 45 y.o.   MRN: [id number]  HPI  [name] is a 45 year old female who presents today for follow up of and incision and drainage of an abscess in left axillary fold. Patient has a history of hidradenitis supprative and states that she has "boils" appear every month. This episode of a boil led to her seeking care in the hospital setting.  Symptoms today include soreness which has improved and limited ability to raise left arm due to resolving abscess. She reports a history of fever but states that last recorded fever for her was noted 2 days ago at 99 degrees F.  She has completed her Rx of amoxicillin/clavulanate and notes that she is only using oxycodone prior to dressing change and prior to bed for sleep. Four days ago, she followed up with [facility] for instructions regarding dressing change. She is completing a damp to dry saline dressing change without difficulty.  BP is noted to be elevated but patient states that this is "normal" for her. She reports not taking her medication for BP today because she has not eaten. She reports that she plans to take this when she returns home.  Review of Systems  Constitutional: Negative for chills and fatigue.  Respiratory: Negative for cough and shortness of breath.   Cardiovascular: Negative for chest pain and palpitations.  Gastrointestinal: Negative for nausea and vomiting.  Musculoskeletal:       LImited ROM for left arm due to resolving wound secondary to incision and drainage  Skin: Negative for color change and rash.   Past Medical History  Diagnosis Date  . Preterm labor   . Hypertension   . Trichomonas   . Hidradenitis suppurativa   . Asthma   . Depression   . Bipolar disorder (HCC)   . Anxiety   . Anemia     Social History   Social History  . Marital Status: Divorced    Spouse Name: N/A  . Number of Children: N/A  . Years of Education: N/A     Occupational History  . Not on file.   Social History Main Topics  . Smoking status: Current Every Day Smoker -- 0.50 packs/day for 16 years    Types: Cigarettes  . Smokeless tobacco: Never Used  . Alcohol Use: Yes     Comment: occasional  . Drug Use: No  . Sexual Activity: Yes    Birth Control/ Protection: Condom, Surgical   Other Topics Concern  . Not on file   Social History Narrative    Past Surgical History  Procedure Laterality Date  . Abdominal hysterectomy    . Mouth surgery    . Hydradenitis excision Right 12/07/2013    Procedure: EXCISION HIDRADENITIS AXILLA right axilla abscess ;  Surgeon: Emelia Loron, MD;  Location: [facility];  Service: General;  Laterality: Right;  . Incision and drainage abscess Left 02/23/2015    Procedure: INCISION AND DRAINAGE ABSCESS;  Surgeon: Avel Peace, MD;  Location: [facility];  Service: General;  Laterality: Left;    Family History  Problem Relation Age of Onset  . Other Neg Hx   . Cancer Mother   . Cancer Maternal Aunt   . Stroke Paternal Uncle   . Diabetes Paternal Uncle   . Cancer Maternal Grandmother   . Cancer Paternal Grandmother     Allergies  Allergen Reactions  . Ace Inhibitors Swelling  angioedema  . Flagyl [Metronidazole] Nausea And Vomiting    Current Outpatient Prescriptions on File Prior to Visit  Medication Sig Dispense Refill  . hydrochlorothiazide (MICROZIDE) 12.5 MG capsule Take 1 capsule (12.5 mg total) by mouth daily. 90 capsule 1  . oxyCODONE (OXY IR/ROXICODONE) 5 MG immediate release tablet Take 1-2 tablets (5-10 mg total) by mouth every 4 (four) hours as needed for moderate pain, severe pain or breakthrough pain. 40 tablet 0  . oxyCODONE (OXY IR/ROXICODONE) 5 MG immediate release tablet Take 1-2 tablets (5-10 mg total) by mouth every 4 (four) hours as needed for moderate pain. 30 tablet 0  . valACYclovir (VALTREX) 1000 MG tablet Take 1 tablet (1,000 mg total) by mouth as needed. 21 tablet 2    No current facility-administered medications on file prior to visit.    BP 138/98 mmHg  Temp(Src) 98.9 F (37.2 C) (Oral)  Wt 154 lb 9.6 oz (70.126 kg)        Objective:   Physical Exam  Constitutional: She is oriented to person, place, and time. She appears well-developed and well-nourished.  Cardiovascular: Normal rate, regular rhythm and normal heart sounds.   Pulmonary/Chest: Effort normal and breath sounds normal.  Lymphadenopathy:    She has no cervical adenopathy.  Neurological: She is alert and oriented to person, place, and time.  Skin: Skin is warm and dry. No erythema.  Left axillae healing without s/sx of infection. No drainage or blood noted on dressing. Wound is pink and moist.             Assessment & Plan:  Left axillary abscess status post incision and drainage resolving without signs and symptoms of infection. Wound bed is pink and moist. No drainage or blood noted on 4 x 4. Patient advised to continue damp to dry saline dressing changes as directed by [facility]. Also advised patient to continue with pain medication on as needed basis. Discussed the option for a referral to dermatology for hidradenitis suppurativa. Elevated BP noted and BP retake was 138/98.  Encouraged patient to take BP medication as directed and monitor BP at home for one week. If BP is >140/90 after medication, she was advised to contact clinic for further follow up.

## 2015-02-28 NOTE — Progress Notes (Signed)
   Subjective:    Patient ID: Yolanda Richard, female    DOB: 07/25/1970, 45 y.o.   MRN: [id number]  HPI    Review of Systems     Objective:   Physical Exam        Assessment & Plan:

## 2015-03-14 ENCOUNTER — Ambulatory Visit (INDEPENDENT_AMBULATORY_CARE_PROVIDER_SITE_OTHER): Payer: Managed Care, Other (non HMO) | Admitting: Family Medicine

## 2015-03-14 ENCOUNTER — Encounter: Payer: Self-pay | Admitting: Family Medicine

## 2015-03-14 VITALS — BP 158/98 | HR 87 | Temp 98.5°F | Ht 63.0 in | Wt 161.0 lb

## 2015-03-14 DIAGNOSIS — I10 Essential (primary) hypertension: Secondary | ICD-10-CM | POA: Diagnosis not present

## 2015-03-14 DIAGNOSIS — F329 Major depressive disorder, single episode, unspecified: Secondary | ICD-10-CM | POA: Diagnosis not present

## 2015-03-14 DIAGNOSIS — F32A Depression, unspecified: Secondary | ICD-10-CM

## 2015-03-14 DIAGNOSIS — L732 Hidradenitis suppurativa: Secondary | ICD-10-CM

## 2015-03-14 MED ORDER — HYDROCHLOROTHIAZIDE 12.5 MG PO CAPS: 12.5000 mg | ORAL_CAPSULE | Freq: Every day | ORAL | Status: AC

## 2015-03-14 NOTE — Progress Notes (Signed)
Pre visit review using our clinic review tool, if applicable. No additional management support is needed unless otherwise documented below in the visit note. 

## 2015-03-14 NOTE — Progress Notes (Signed)
Subjective:    Patient ID: Yolanda Richard, female    DOB: [birth year], 45 y.o.   MRN: [id number]  HPI   Patient presents today to establish care following a depression screening through her insurance provider and recent exacerbation of Hidradenitis suppurativa with an abcess under left axaillae.   Essential HTN Patient reports that she has BP medication but she does not wish to take it because her main concern is  Stage II Hidradenitis suppurativa flares. BP today is elevated and patient also reported smoking a cigarette just prior to this visit. She denies chest pain, arm pain, headaches, dizziness, and visual disturbances.  Hidradenitis Suppurativa: Left axillary abscess is healing wihtout s/sx of infection noted.  Wound bed is pink and moist and patient denies pain. She states that she experiences some discomfort when raising arm but it is much better today. Today, patient has placed an aloe vera cream around the healing wound which is noted in the photo below. Currently wound care: damp to dry saline dressings per [facility]. Patient denies pain today and states that she only feels some discomfort when raising arm.      Depression: Patient stated that she is feeling down due to her recent exacerbations of HS. She noted that this condition has caused her to isolate herself from others at times and she just does not want to continue treating this conditions in the same manner. She feels "helpless" when caring for this condition. She denied suicidal ideation/plan, stating this she is depressed and is currently seeing a psychiatrist for counseling and possible medication management. During this visit patient was smiling and talking about her grandchildren. She noted depression, however stated that she believes it is solely related to HS. She reports a history of zoloft but she is not taking it at this time. Her follow up appointment is in 2 weeks and she agreed to contact her provider  sooner if needed.  Discussed smoking cessation with patient. She currently smokes 1/2 ppd and expressed interest in cutting back. Patient has tried Chantix and stated she did not like that way "it made her feel". Encouraged patient to consider nicotine patches/gum. Also suggested that she contact her insurance company for smoking cessation options and coverage also.  Review of Systems  Constitutional: Negative for fever, chills and fatigue.  HENT: Negative.   Respiratory: Negative for cough, choking, chest tightness, shortness of breath and wheezing.   Cardiovascular: Negative for chest pain, palpitations and leg swelling.  Gastrointestinal: Negative for nausea, vomiting, diarrhea, constipation and blood in stool.  Genitourinary: Negative for dysuria and urgency.  Musculoskeletal: Negative for arthralgias.  Skin: Positive for wound.       Post incision and drainage left axillary abscess healing  Neurological: Negative for dizziness, light-headedness, numbness and headaches.  Psychiatric/Behavioral: Negative for suicidal ideas and sleep disturbance. The patient is not nervous/anxious.        Patient notes history of depression. Currently in counseling and under psychiatric care    Past Medical History  Diagnosis Date  . Preterm labor   . Hypertension   . Trichomonas   . Hidradenitis suppurativa   . Asthma   . Depression   . Bipolar disorder (HCC)   . Anxiety   . Anemia     Social History   Social History  . Marital Status: Divorced    Spouse Name: N/A  . Number of Children: N/A  . Years of Education: N/A   Occupational History  . Not on file.  Social History Main Topics  . Smoking status: Current Every Day Smoker -- 0.50 packs/day for 16 years    Types: Cigarettes  . Smokeless tobacco: Never Used  . Alcohol Use: Yes     Comment: occasional  . Drug Use: No  . Sexual Activity: Yes    Birth Control/ Protection: Condom, Surgical   Other Topics Concern  . Not on file     Social History Narrative    Past Surgical History  Procedure Laterality Date  . Abdominal hysterectomy    . Mouth surgery    . Hydradenitis excision Right 12/07/2013    Procedure: EXCISION HIDRADENITIS AXILLA right axilla abscess ;  Surgeon: Emelia Loron, MD;  Location: [facility];  Service: General;  Laterality: Right;  . Incision and drainage abscess Left 02/23/2015    Procedure: INCISION AND DRAINAGE ABSCESS;  Surgeon: Avel Peace, MD;  Location: [facility];  Service: General;  Laterality: Left;    Family History  Problem Relation Age of Onset  . Other Neg Hx   . Cancer Mother   . Cancer Maternal Aunt   . Stroke Paternal Uncle   . Diabetes Paternal Uncle   . Cancer Maternal Grandmother   . Cancer Paternal Grandmother     Allergies  Allergen Reactions  . Ace Inhibitors Swelling    angioedema  . Flagyl [Metronidazole] Nausea And Vomiting    Current Outpatient Prescriptions on File Prior to Visit  Medication Sig Dispense Refill  . oxyCODONE (OXY IR/ROXICODONE) 5 MG immediate release tablet Take 1-2 tablets (5-10 mg total) by mouth every 4 (four) hours as needed for moderate pain, severe pain or breakthrough pain. 40 tablet 0  . oxyCODONE (OXY IR/ROXICODONE) 5 MG immediate release tablet Take 1-2 tablets (5-10 mg total) by mouth every 4 (four) hours as needed for moderate pain. 30 tablet 0  . valACYclovir (VALTREX) 1000 MG tablet Take 1 tablet (1,000 mg total) by mouth as needed. 21 tablet 2   No current facility-administered medications on file prior to visit.    BP 160/100 mmHg  Pulse 87  Temp(Src) 98.5 F (36.9 C) (Oral)  Ht 5\' 3"  (1.6 m)  Wt 161 lb (73.029 kg)  BMI 28.53 kg/m2  SpO2 98%     Objective:   Physical Exam  Constitutional: She is oriented to person, place, and time. She appears well-developed and well-nourished.  Eyes: Pupils are equal, round, and reactive to light.  Cardiovascular: Normal rate, regular rhythm and normal heart sounds.    Pulmonary/Chest: Effort normal and breath sounds normal. She has no wheezes. She has no rales.  Abdominal: Soft. Bowel sounds are normal. She exhibits no distension. There is no tenderness.  Lymphadenopathy:    She has no cervical adenopathy.  Neurological: She is alert and oriented to person, place, and time.  Skin: Skin is warm and dry.  Post incision and drainage left axillary abscess healing. Wound bed is pink and moist. Patient has used an aloe vera cream today which is noted in photo. No drainage noted directly from wound.       Assessment & Plan:  1. Hidradenitis suppurativa  Left abscess of axillary fold is healing. Wound is pink and moist. Patient had applied some aloe vera cream that is noted in the photo. She has a follow up appointment with [facility] post incision and drainage of left axillary abscess in 2 weeks. Current damp to dry saline dressings are being completed by patient. Encouraged patient to follow wound care protocol and  avoid aloe vera lotion at this time.  Patient has noted that her exacerbations with this condition leading to abscesses that require incision and drainage is her greatest concern. Referral placed - Ambulatory referral to General Surgery to evaluate and treat this chronic condition. Patient was pleased to explore preventive treatment options for this condition.  2.  Essential HTN: Refilled hydrochlorothiazide 12.5 mg. Patient stated that she has not taken this medication in some time and did not want to focus on her BP, rather she was most concerned with HS. Advised patient that her BP remains elevated and that this is important to address immediately for cardiac protection and stroke prevention. Patient agreed to restart BP, monitor at home, and will follow up in 2 weeks for further evaluation. DASH diet discussed with exercise, and smoking cessation. She voiced understanding and agreed with plan.  3.  Depression: Patient is currently seeing a  psychiatrist in a local group for therapy and medication management. Currently, she is not taking her medication and she has a follow up appointment with her psychiatrist to discuss options for treatment and potential adverse effects of medications. Patient stated that she would not hurt herself and denied suicidal ideation or plan.

## 2015-03-14 NOTE — Patient Instructions (Signed)
Please restart Hydrochlorothiazide 12.5 mg daily for BP. Monitor BP at home and bring results back with you to next appointment in 2 weeks.  Consider decreasing sugary drinks and increase water intake as we discussed. Great to hear that you are considering smoking cessation. Nicotine gum or patches can be considered. Please consult with insurance company for best options regarding tobacco cessation. Referral to general surgery has been placed to address Hidradenitis Suppurativa.    Keep appointment with psychiatrist for counseling and medication concerns as scheduled. Follow up in 2 weeks to assess BP and progress towards smoking cessation.

## 2015-03-18 ENCOUNTER — Encounter: Payer: Self-pay | Admitting: Family Medicine

## 2015-03-18 MED ORDER — CLINDAMYCIN PHOSPHATE 1 % EX LOTN: TOPICAL_LOTION | Freq: Two times a day (BID) | CUTANEOUS | Status: DC

## 2015-03-18 MED ORDER — DOXYCYCLINE HYCLATE 100 MG PO TABS: 100.0000 mg | ORAL_TABLET | Freq: Two times a day (BID) | ORAL | Status: DC

## 2015-03-18 NOTE — Telephone Encounter (Signed)
Doxycycline 100 mg BID for 30 days with Clindamycin topical location BID until patient goes for general surgery ambulatory referral that was placed last week. If symptoms worsen, or fever develops >101, please follow up in clinic for further evaluation. If you do not hear from general surgery for an appointment this week, please call referral office.

## 2015-03-28 ENCOUNTER — Ambulatory Visit (INDEPENDENT_AMBULATORY_CARE_PROVIDER_SITE_OTHER): Payer: Managed Care, Other (non HMO) | Admitting: Family Medicine

## 2015-03-28 ENCOUNTER — Encounter: Payer: Self-pay | Admitting: Family Medicine

## 2015-03-28 VITALS — BP 146/100 | HR 66 | Temp 98.4°F | Wt 158.8 lb

## 2015-03-28 DIAGNOSIS — L732 Hidradenitis suppurativa: Secondary | ICD-10-CM | POA: Diagnosis not present

## 2015-03-28 DIAGNOSIS — I10 Essential (primary) hypertension: Secondary | ICD-10-CM

## 2015-03-28 MED ORDER — AMLODIPINE BESYLATE 2.5 MG PO TABS: 2.5000 mg | ORAL_TABLET | Freq: Every day | ORAL | Status: DC

## 2015-03-28 MED ORDER — FLUCONAZOLE 150 MG PO TABS: 150.0000 mg | ORAL_TABLET | Freq: Once | ORAL | Status: DC

## 2015-03-28 NOTE — Patient Instructions (Signed)
Please start amlodipine daily and monitor BP daily at home. Record BPs for 2 weeks and follow up for reevaluation of BP.   You have been referred to a general surgeon for evaluation and treatment of Hidradenitis. Please call the number below and set up an appointment. [phone].  Great work cutting back on your cigarette use! Keep up the great work to reach the goal of no cigarette use.  Continue doxycycline as prescribed and follow up with general surgeon for treatment and evaluation.  Hypertension Hypertension, commonly called high blood pressure, is when the force of blood pumping through your arteries is too strong. Your arteries are the blood vessels that carry blood from your heart throughout your body. A blood pressure reading consists of a higher number over a lower number, such as 110/72. The higher number (systolic) is the pressure inside your arteries when your heart pumps. The lower number (diastolic) is the pressure inside your arteries when your heart relaxes. Ideally you want your blood pressure below 120/80. Hypertension forces your heart to work harder to pump blood. Your arteries may become narrow or stiff. Having untreated or uncontrolled hypertension can cause heart attack, stroke, kidney disease, and other problems. RISK FACTORS Some risk factors for high blood pressure are controllable. Others are not.  Risk factors you cannot control include:   Race. You may be at higher risk if you are African American.  Age. Risk increases with age.  Gender. Men are at higher risk than women before age 96 years. After age 64, women are at higher risk than men. Risk factors you can control include:  Not getting enough exercise or physical activity.  Being overweight.  Getting too much fat, sugar, calories, or salt in your diet.  Drinking too much alcohol. SIGNS AND SYMPTOMS Hypertension does not usually cause signs or symptoms. Extremely high blood pressure (hypertensive  crisis) may cause headache, anxiety, shortness of breath, and nosebleed. DIAGNOSIS To check if you have hypertension, your health care provider will measure your blood pressure while you are seated, with your arm held at the level of your heart. It should be measured at least twice using the same arm. Certain conditions can cause a difference in blood pressure between your right and left arms. A blood pressure reading that is higher than normal on one occasion does not mean that you need treatment. If it is not clear whether you have high blood pressure, you may be asked to return on a different day to have your blood pressure checked again. Or, you may be asked to monitor your blood pressure at home for 1 or more weeks. TREATMENT Treating high blood pressure includes making lifestyle changes and possibly taking medicine. Living a healthy lifestyle can help lower high blood pressure. You may need to change some of your habits. Lifestyle changes may include:  Following the DASH diet. This diet is high in fruits, vegetables, and whole grains. It is low in salt, red meat, and added sugars.  Keep your sodium intake below 2,300 mg per day.  Getting at least 30-45 minutes of aerobic exercise at least 4 times per week.  Losing weight if necessary.  Not smoking.  Limiting alcoholic beverages.  Learning ways to reduce stress. Your health care provider may prescribe medicine if lifestyle changes are not enough to get your blood pressure under control, and if one of the following is true:  You are 78-27 years of age and your systolic blood pressure is above 140.  You  are 86 years of age or older, and your systolic blood pressure is above 150.  Your diastolic blood pressure is above 90.  You have diabetes, and your systolic blood pressure is over 140 or your diastolic blood pressure is over 90.  You have kidney disease and your blood pressure is above 140/90.  You have heart disease and your  blood pressure is above 140/90. Your personal target blood pressure may vary depending on your medical conditions, your age, and other factors. HOME CARE INSTRUCTIONS  Have your blood pressure rechecked as directed by your health care provider.   Take medicines only as directed by your health care provider. Follow the directions carefully. Blood pressure medicines must be taken as prescribed. The medicine does not work as well when you skip doses. Skipping doses also puts you at risk for problems.  Do not smoke.   Monitor your blood pressure at home as directed by your health care provider. SEEK MEDICAL CARE IF:   You think you are having a reaction to medicines taken.  You have recurrent headaches or feel dizzy.  You have swelling in your ankles.  You have trouble with your vision. SEEK IMMEDIATE MEDICAL CARE IF:  You develop a severe headache or confusion.  You have unusual weakness, numbness, or feel faint.  You have severe chest or abdominal pain.  You vomit repeatedly.  You have trouble breathing. MAKE SURE YOU:   Understand these instructions.  Will watch your condition.  Will get help right away if you are not doing well or get worse.   This information is not intended to replace advice given to you by your health care provider. Make sure you discuss any questions you have with your health care provider.   Document Released: 01/19/2005 Document Revised: 06/05/2014 Document Reviewed: 11/11/2012 Elsevier Interactive Patient Education [employer]

## 2015-03-28 NOTE — Progress Notes (Signed)
Subjective:    Patient ID: Yolanda Richard, female    DOB: 02-13-1970, 45 y.o.   MRN: [id number]  HPI  [name] is a 45 year old female who presents today for a blood pressure follow up after resuming her hydrochlorothiazide 12.5 mg.  She has been taking this medication for 2 weeks and has decreased her salt intake and decreased number of cigarettes/day.  Pertinent history includes Stage II Hidradenitis suppurativa for which multiple abscesses have occurred.  Today, the left abscess of the axillary fold is healing and patient remains on doxycycline daily until she is evaluated by general surgery for further treatment options.  At this time, she denies headaches, visual disturbances, chest pain, SOB, palpitations, arm pain, dizziness, and fatigue.  She is currently following the DASH diet and declines pharmacological therapy for smoking cessation. She states that she is cutting back and would like to do this without pharmacological therapy.    Review of Systems  Constitutional: Negative for chills and fatigue.  HENT: Negative for congestion, postnasal drip, rhinorrhea, sinus pressure and sneezing.   Respiratory: Negative for cough, chest tightness, shortness of breath and wheezing.   Cardiovascular: Negative for chest pain, palpitations and leg swelling.  Gastrointestinal: Negative for nausea, vomiting, abdominal pain, diarrhea and constipation.  Genitourinary: Negative for dysuria, urgency, hematuria and flank pain.  Musculoskeletal: Negative for myalgias and arthralgias.  Skin: Positive for wound. Negative for rash.       Left axillary abscess wound healing without s/sx of infection post incision and drainage  Neurological: Negative for dizziness, light-headedness and headaches.  Psychiatric/Behavioral:       Denies depressed mood or behavioral changes    Past Medical History  Diagnosis Date  . Preterm labor   . Hypertension   . Trichomonas   . Hidradenitis suppurativa   .  Asthma   . Depression   . Bipolar disorder (HCC)   . Anxiety   . Anemia     Social History   Social History  . Marital Status: Divorced    Spouse Name: N/A  . Number of Children: N/A  . Years of Education: N/A   Occupational History  . Not on file.   Social History Main Topics  . Smoking status: Current Every Day Smoker -- 0.50 packs/day for 16 years    Types: Cigarettes  . Smokeless tobacco: Never Used  . Alcohol Use: Yes     Comment: occasional  . Drug Use: No  . Sexual Activity: Yes    Birth Control/ Protection: Condom, Surgical   Other Topics Concern  . Not on file   Social History Narrative    Past Surgical History  Procedure Laterality Date  . Abdominal hysterectomy    . Mouth surgery    . Hydradenitis excision Right 12/07/2013    Procedure: EXCISION HIDRADENITIS AXILLA right axilla abscess ;  Surgeon: Emelia Loron, MD;  Location: [facility];  Service: General;  Laterality: Right;  . Incision and drainage abscess Left 02/23/2015    Procedure: INCISION AND DRAINAGE ABSCESS;  Surgeon: Avel Peace, MD;  Location: [facility];  Service: General;  Laterality: Left;    Family History  Problem Relation Age of Onset  . Other Neg Hx   . Cancer Mother   . Cancer Maternal Aunt   . Stroke Paternal Uncle   . Diabetes Paternal Uncle   . Cancer Maternal Grandmother   . Cancer Paternal Grandmother     Allergies  Allergen Reactions  . Ace Inhibitors Swelling  angioedema  . Flagyl [Metronidazole] Nausea And Vomiting    Current Outpatient Prescriptions on File Prior to Visit  Medication Sig Dispense Refill  . clindamycin (CLEOCIN T) 1 % lotion Apply topically 2 (two) times daily. 60 mL 0  . doxycycline (VIBRA-TABS) 100 MG tablet Take 1 tablet (100 mg total) by mouth 2 (two) times daily. 60 tablet 0  . hydrochlorothiazide (MICROZIDE) 12.5 MG capsule Take 1 capsule (12.5 mg total) by mouth daily. 30 capsule 0  . oxyCODONE (OXY IR/ROXICODONE) 5 MG immediate release  tablet Take 1-2 tablets (5-10 mg total) by mouth every 4 (four) hours as needed for moderate pain, severe pain or breakthrough pain. 40 tablet 0  . oxyCODONE (OXY IR/ROXICODONE) 5 MG immediate release tablet Take 1-2 tablets (5-10 mg total) by mouth every 4 (four) hours as needed for moderate pain. 30 tablet 0  . valACYclovir (VALTREX) 1000 MG tablet Take 1 tablet (1,000 mg total) by mouth as needed. 21 tablet 2   No current facility-administered medications on file prior to visit.    BP 146/100 mmHg  Pulse 66  Temp(Src) 98.4 F (36.9 C) (Oral)  Wt 158 lb 12.8 oz (72.031 kg)  SpO2 96%        Objective:   Physical Exam  Constitutional: She is oriented to person, place, and time. She appears well-developed and well-nourished.  Eyes: Pupils are equal, round, and reactive to light.  Cardiovascular: Normal rate and regular rhythm.   No murmur heard. Pulmonary/Chest: Effort normal and breath sounds normal. She has no wheezes. She has no rales.  Abdominal: Soft. Bowel sounds are normal. There is no tenderness.  Lymphadenopathy:    She has no cervical adenopathy.  Neurological: She is alert and oriented to person, place, and time.  Skin: Skin is warm and dry. No rash noted.  Left axillary abscess healing without s/sx of infection. Aloe vera cream is noted in photo   Psychiatric: She has a normal mood and affect. Her behavior is normal.  Patient smiling and notes that her mood has improved. She is followed by psychiatry        Assessment & Plan:  1. Essential hypertension Continue hydrochlorothiazide 12.5 mg daily and add amlodipine 2.5 mg daily. - amLODipine (NORVASC) 2.5 MG tablet; Take 1 tablet (2.5 mg total) by mouth daily.  Dispense: 30 tablet; Refill: 1 Advised patient to monitor BP at home for 2 weeks, document readings, and return for BP evaluation and follow up. Discussed DASH diet with patient, exercise, and strongly encouraged smoking cessation.  She voiced understanding  and agreed with plan.   2. Hidradenitis suppurativa  Left axillary abscess following incision and drainage is healing. She is followed by [facility] for damp to dry saline dressing changes.  Encouraged patient to continue following wound care protocol and avoid aloe vera lotion use.  Referral to general surgery for evaluation and  treatment of reoccurring abscesses has been put in place. She will continue doxycycline therapy daily until general surgery evaluation and treatment options for history of multiple reoccurrences. Patient wishes to address problem areas such as axillae and groin and discuss potential future treatments to decrease trips to the ED for incision and drainage.

## 2015-03-28 NOTE — Progress Notes (Signed)
Pre visit review using our clinic review tool, if applicable. No additional management support is needed unless otherwise documented below in the visit note. 

## 2015-04-05 ENCOUNTER — Telehealth: Payer: Self-pay | Admitting: Family Medicine

## 2015-04-05 NOTE — Telephone Encounter (Signed)
Returned call to patient who requested to speak with office manager after being informed by office staff that PCP was not able to complete patient's FMLA paperwork for her job.  Patient states she has been diagnosed with hidradenitis suppurative and had surgery in January.  Her surgeon was able to complete the FMLA paperwork for her for the timeframe during and after her surgery.  However, now that she has been released from the surgeon, her job requires her to have intermittent FMLA on file, which is what the patient is requesting.  If and when she needs to be out of work for this condition or to come in for office visits related to this condition she has to report it to her job as time away from work due to this condition.  Patient is asking PCP to reconsider decision to complete intermittent FMLA paperwork.  I informed patient PCP is out of the office today and will not be back until Monday and I would follow up with her at that time.

## 2015-04-09 ENCOUNTER — Encounter (HOSPITAL_BASED_OUTPATIENT_CLINIC_OR_DEPARTMENT_OTHER): Payer: Self-pay | Admitting: *Deleted

## 2015-04-10 ENCOUNTER — Encounter (HOSPITAL_BASED_OUTPATIENT_CLINIC_OR_DEPARTMENT_OTHER)
Admission: RE | Admit: 2015-04-10 | Discharge: 2015-04-10 | Disposition: A | Discharge status: Home / Self Care | Payer: Managed Care, Other (non HMO) | Source: Ambulatory Visit | Attending: General Surgery | Admitting: General Surgery

## 2015-04-10 DIAGNOSIS — Z792 Long term (current) use of antibiotics: Secondary | ICD-10-CM | POA: Diagnosis not present

## 2015-04-10 DIAGNOSIS — I10 Essential (primary) hypertension: Secondary | ICD-10-CM | POA: Diagnosis not present

## 2015-04-10 DIAGNOSIS — Z79899 Other long term (current) drug therapy: Secondary | ICD-10-CM | POA: Diagnosis not present

## 2015-04-10 DIAGNOSIS — F17200 Nicotine dependence, unspecified, uncomplicated: Secondary | ICD-10-CM | POA: Diagnosis not present

## 2015-04-10 DIAGNOSIS — L732 Hidradenitis suppurativa: Secondary | ICD-10-CM | POA: Diagnosis not present

## 2015-04-10 LAB — BASIC METABOLIC PANEL
ANION GAP: 12 (ref 5–15)
BUN: 6 mg/dL (ref 6–20)
CO2: 23 mmol/L (ref 22–32)
Calcium: 9.3 mg/dL (ref 8.9–10.3)
Chloride: 101 mmol/L (ref 101–111)
Creatinine, Ser: 0.91 mg/dL (ref 0.44–1.00)
GFR calc Af Amer: >60 mL/min (ref >60)
GFR calc non Af Amer: >60 mL/min (ref >60)
GLUCOSE: 82 mg/dL (ref 65–99)
POTASSIUM: 3.8 mmol/L (ref 3.5–5.1)
Sodium: 136 mmol/L (ref 135–145)

## 2015-04-11 ENCOUNTER — Ambulatory Visit: Payer: Managed Care, Other (non HMO) | Admitting: Family Medicine

## 2015-04-11 ENCOUNTER — Other Ambulatory Visit: Payer: Self-pay | Admitting: General Surgery

## 2015-04-12 ENCOUNTER — Encounter (HOSPITAL_BASED_OUTPATIENT_CLINIC_OR_DEPARTMENT_OTHER): Payer: Self-pay

## 2015-04-12 ENCOUNTER — Encounter (HOSPITAL_BASED_OUTPATIENT_CLINIC_OR_DEPARTMENT_OTHER)
Admission: RE | Disposition: A | Discharge status: Home / Self Care | Payer: Self-pay | Source: Ambulatory Visit | Attending: General Surgery

## 2015-04-12 ENCOUNTER — Ambulatory Visit (HOSPITAL_BASED_OUTPATIENT_CLINIC_OR_DEPARTMENT_OTHER): Payer: Managed Care, Other (non HMO) | Admitting: Anesthesiology

## 2015-04-12 ENCOUNTER — Ambulatory Visit (HOSPITAL_BASED_OUTPATIENT_CLINIC_OR_DEPARTMENT_OTHER)
Admission: RE | Admit: 2015-04-12 | Discharge: 2015-04-12 | Disposition: A | Discharge status: Home / Self Care | Payer: Managed Care, Other (non HMO) | Source: Ambulatory Visit | Attending: General Surgery | Admitting: General Surgery

## 2015-04-12 DIAGNOSIS — Z792 Long term (current) use of antibiotics: Secondary | ICD-10-CM | POA: Insufficient documentation

## 2015-04-12 DIAGNOSIS — L732 Hidradenitis suppurativa: Secondary | ICD-10-CM | POA: Diagnosis not present

## 2015-04-12 DIAGNOSIS — Z79899 Other long term (current) drug therapy: Secondary | ICD-10-CM | POA: Insufficient documentation

## 2015-04-12 DIAGNOSIS — I10 Essential (primary) hypertension: Secondary | ICD-10-CM | POA: Insufficient documentation

## 2015-04-12 DIAGNOSIS — F17200 Nicotine dependence, unspecified, uncomplicated: Secondary | ICD-10-CM | POA: Insufficient documentation

## 2015-04-12 HISTORY — PX: HYDRADENITIS EXCISION: SHX5243

## 2015-04-12 SURGERY — EXCISION, HIDRADENITIS, AXILLA
Anesthesia: General | Site: Axilla | Laterality: Bilateral

## 2015-04-12 MED ORDER — VANCOMYCIN HCL IN DEXTROSE 1-5 GM/200ML-% IV SOLN
INTRAVENOUS | Status: AC
  Filled 2015-04-12: qty 200

## 2015-04-12 MED ORDER — PROPOFOL 10 MG/ML IV BOLUS
INTRAVENOUS | Status: AC
  Filled 2015-04-12: qty 20

## 2015-04-12 MED ORDER — LACTATED RINGERS IV SOLN
INTRAVENOUS | Status: DC
  Administered 2015-04-12 (×2): via INTRAVENOUS

## 2015-04-12 MED ORDER — FENTANYL CITRATE (PF) 100 MCG/2ML IJ SOLN
INTRAMUSCULAR | Status: AC
  Filled 2015-04-12: qty 2

## 2015-04-12 MED ORDER — VANCOMYCIN HCL IN DEXTROSE 1-5 GM/200ML-% IV SOLN
1000.0000 mg | INTRAVENOUS | Status: AC
  Administered 2015-04-12: 1000 mg via INTRAVENOUS

## 2015-04-12 MED ORDER — HYDROMORPHONE HCL 1 MG/ML IJ SOLN
INTRAMUSCULAR | Status: AC
  Filled 2015-04-12: qty 1

## 2015-04-12 MED ORDER — CHLORHEXIDINE GLUCONATE 4 % EX LIQD: 1.0000 "application " | Freq: Once | CUTANEOUS | Status: DC

## 2015-04-12 MED ORDER — ONDANSETRON HCL 4 MG/2ML IJ SOLN
INTRAMUSCULAR | Status: AC
  Filled 2015-04-12: qty 2

## 2015-04-12 MED ORDER — HYDRALAZINE HCL 20 MG/ML IJ SOLN
10.0000 mg | Freq: Once | INTRAMUSCULAR | Status: AC
  Administered 2015-04-12: 10 mg via INTRAVENOUS

## 2015-04-12 MED ORDER — FENTANYL CITRATE (PF) 100 MCG/2ML IJ SOLN
50.0000 ug | INTRAMUSCULAR | Status: AC | PRN
  Administered 2015-04-12 (×3): 50 ug via INTRAVENOUS
  Administered 2015-04-12: 100 ug via INTRAVENOUS
  Administered 2015-04-12: 50 ug via INTRAVENOUS

## 2015-04-12 MED ORDER — PROPOFOL 10 MG/ML IV BOLUS
INTRAVENOUS | Status: DC | PRN
  Administered 2015-04-12: 180 mg via INTRAVENOUS

## 2015-04-12 MED ORDER — PROMETHAZINE HCL 25 MG/ML IJ SOLN: 6.2500 mg | INTRAMUSCULAR | Status: DC | PRN

## 2015-04-12 MED ORDER — DEXAMETHASONE SODIUM PHOSPHATE 10 MG/ML IJ SOLN
INTRAMUSCULAR | Status: AC
  Filled 2015-04-12: qty 1

## 2015-04-12 MED ORDER — HYDRALAZINE HCL 20 MG/ML IJ SOLN
INTRAMUSCULAR | Status: AC
  Filled 2015-04-12: qty 1

## 2015-04-12 MED ORDER — ONDANSETRON HCL 4 MG/2ML IJ SOLN
INTRAMUSCULAR | Status: DC | PRN
  Administered 2015-04-12: 4 mg via INTRAVENOUS

## 2015-04-12 MED ORDER — SCOPOLAMINE 1 MG/3DAYS TD PT72: 1.0000 | MEDICATED_PATCH | Freq: Once | TRANSDERMAL | Status: DC | PRN

## 2015-04-12 MED ORDER — GLYCOPYRROLATE 0.2 MG/ML IJ SOLN: 0.2000 mg | Freq: Once | INTRAMUSCULAR | Status: DC | PRN

## 2015-04-12 MED ORDER — BACITRACIN-NEOMYCIN-POLYMYXIN OINTMENT TUBE
TOPICAL_OINTMENT | CUTANEOUS | Status: DC | PRN
  Administered 2015-04-12: 1 via TOPICAL

## 2015-04-12 MED ORDER — OXYCODONE HCL 5 MG PO TABS
5.0000 mg | ORAL_TABLET | Freq: Once | ORAL | Status: AC
  Administered 2015-04-12: 5 mg via ORAL

## 2015-04-12 MED ORDER — LIDOCAINE HCL (CARDIAC) 20 MG/ML IV SOLN
INTRAVENOUS | Status: AC
  Filled 2015-04-12: qty 5

## 2015-04-12 MED ORDER — BUPIVACAINE-EPINEPHRINE 0.25% -1:200000 IJ SOLN
INTRAMUSCULAR | Status: DC | PRN
  Administered 2015-04-12: 18 mL

## 2015-04-12 MED ORDER — DEXAMETHASONE SODIUM PHOSPHATE 4 MG/ML IJ SOLN
INTRAMUSCULAR | Status: DC | PRN
  Administered 2015-04-12: 10 mg via INTRAVENOUS

## 2015-04-12 MED ORDER — MIDAZOLAM HCL 2 MG/2ML IJ SOLN
1.0000 mg | INTRAMUSCULAR | Status: DC | PRN
  Administered 2015-04-12: 2 mg via INTRAVENOUS

## 2015-04-12 MED ORDER — HYDROMORPHONE HCL 1 MG/ML IJ SOLN
0.2500 mg | INTRAMUSCULAR | Status: DC | PRN
  Administered 2015-04-12 (×3): 0.5 mg via INTRAVENOUS

## 2015-04-12 MED ORDER — OXYCODONE HCL 5 MG PO TABS
ORAL_TABLET | ORAL | Status: AC
  Filled 2015-04-12: qty 1

## 2015-04-12 MED ORDER — LIDOCAINE HCL (CARDIAC) 20 MG/ML IV SOLN
INTRAVENOUS | Status: DC | PRN
  Administered 2015-04-12: 60 mg via INTRAVENOUS

## 2015-04-12 MED ORDER — OXYCODONE-ACETAMINOPHEN 5-325 MG PO TABS: 1.0000 | ORAL_TABLET | ORAL | Status: DC | PRN

## 2015-04-12 MED ORDER — BACITRACIN-NEOMYCIN-POLYMYXIN 400-5-5000 EX OINT
TOPICAL_OINTMENT | CUTANEOUS | Status: AC
  Filled 2015-04-12: qty 1

## 2015-04-12 MED ORDER — MIDAZOLAM HCL 2 MG/2ML IJ SOLN
INTRAMUSCULAR | Status: AC
  Filled 2015-04-12: qty 2

## 2015-04-12 SURGICAL SUPPLY — 38 items
BLADE SURG 10 STRL SS (BLADE) ×3 IMPLANT
BLADE SURG 15 STRL LF DISP TIS (BLADE) ×1 IMPLANT
BLADE SURG 15 STRL SS (BLADE) ×3
BNDG GAUZE ELAST 4 BULKY (GAUZE/BANDAGES/DRESSINGS) ×2 IMPLANT
CANISTER SUCT 1200ML W/VALVE (MISCELLANEOUS) ×3 IMPLANT
CHLORAPREP W/TINT 26ML (MISCELLANEOUS) ×3 IMPLANT
COVER BACK TABLE 60X90IN (DRAPES) ×3 IMPLANT
COVER MAYO STAND STRL (DRAPES) ×3 IMPLANT
DRAPE LAPAROSCOPIC ABDOMINAL (DRAPES) ×3 IMPLANT
DRAPE UTILITY XL STRL (DRAPES) ×3 IMPLANT
DRSG PAD ABDOMINAL 8X10 ST (GAUZE/BANDAGES/DRESSINGS) ×4 IMPLANT
ELECT COATED BLADE 2.86 ST (ELECTRODE) ×3 IMPLANT
ELECT REM PT RETURN 9FT ADLT (ELECTROSURGICAL) ×3
ELECTRODE REM PT RTRN 9FT ADLT (ELECTROSURGICAL) ×1 IMPLANT
GLOVE BIO SURGEON STRL SZ7.5 (GLOVE) ×3 IMPLANT
GLOVE BIOGEL PI IND STRL 7.0 (GLOVE) IMPLANT
GLOVE BIOGEL PI INDICATOR 7.0 (GLOVE) ×2
GLOVE ECLIPSE 6.5 STRL STRAW (GLOVE) ×2 IMPLANT
GOWN STRL REUS W/ TWL LRG LVL3 (GOWN DISPOSABLE) ×2 IMPLANT
GOWN STRL REUS W/TWL LRG LVL3 (GOWN DISPOSABLE) ×6
NDL HYPO 25X1 1.5 SAFETY (NEEDLE) ×1 IMPLANT
NEEDLE HYPO 25X1 1.5 SAFETY (NEEDLE) ×3 IMPLANT
NS IRRIG 1000ML POUR BTL (IV SOLUTION) ×3 IMPLANT
PACK BASIN DAY SURGERY FS (CUSTOM PROCEDURE TRAY) ×3 IMPLANT
PENCIL BUTTON HOLSTER BLD 10FT (ELECTRODE) ×3 IMPLANT
SLEEVE SCD COMPRESS KNEE MED (MISCELLANEOUS) ×3 IMPLANT
SPONGE LAP 18X18 X RAY DECT (DISPOSABLE) ×3 IMPLANT
STAPLER VISISTAT 35W (STAPLE) ×2 IMPLANT
SUT ETHILON 3 0 PS 1 (SUTURE) ×4 IMPLANT
SUT MON AB 4-0 PC3 18 (SUTURE) ×3 IMPLANT
SUT VIC AB 3-0 SH 27 (SUTURE) ×3
SUT VIC AB 3-0 SH 27X BRD (SUTURE) ×1 IMPLANT
SYR CONTROL 10ML LL (SYRINGE) ×3 IMPLANT
TOWEL OR 17X24 6PK STRL BLUE (TOWEL DISPOSABLE) ×3 IMPLANT
TOWEL OR NON WOVEN STRL DISP B (DISPOSABLE) ×3 IMPLANT
TUBE CONNECTING 20'X1/4 (TUBING) ×1
TUBE CONNECTING 20X1/4 (TUBING) ×2 IMPLANT
YANKAUER SUCT BULB TIP NO VENT (SUCTIONS) ×3 IMPLANT

## 2015-04-12 NOTE — Interval H&P Note (Signed)
History and Physical Interval Note:  04/12/2015 2:58 PM  ZOXWRUEAShaunita Clinton Richard  has presented today for surgery, with the diagnosis of bilateral hidradenitis axillary  The various methods of treatment have been discussed with the patient and family. After consideration of risks, benefits and other options for treatment, the patient has consented to  Procedure(s): INCISION AND DRAINAGE BILATERAL HIDRADENITIS AXILLA (Bilateral) as a surgical intervention .  The patient's history has been reviewed, patient examined, no change in status, stable for surgery.  I have reviewed the patient's chart and labs.  Questions were answered to the patient's satisfaction.     TOTH III,Lazarius Rivkin S

## 2015-04-12 NOTE — Discharge Instructions (Signed)

## 2015-04-12 NOTE — Op Note (Signed)
04/12/2015  4:00 PM  PATIENT:  Yolanda Richard  45 y.o. female  PRE-OPERATIVE DIAGNOSIS:  bilateral hidradenitis axillary  POST-OPERATIVE DIAGNOSIS:  bilateral hidradenitis axillary  PROCEDURE:  Procedure(s): INCISION AND DRAINAGE BILATERAL HIDRADENITIS AXILLA (Bilateral)  SURGEON:  Surgeon(s) and Role:    * Griselda MinerPaul Toth III, MD - Primary  PHYSICIAN ASSISTANT:   ASSISTANTS: none   ANESTHESIA:   general  EBL:  Total I/O In: 300 [I.V.:300] Out: 50 [Blood:50]  BLOOD ADMINISTERED:none  DRAINS: none   LOCAL MEDICATIONS USED:  MARCAINE     SPECIMEN:  Source of Specimen:  bilateral axillary hidradenitis  DISPOSITION OF SPECIMEN:  PATHOLOGY  COUNTS:  YES  TOURNIQUET:  * No tourniquets in log *  DICTATION: .Dragon Dictation   After informed consent was obtained the patient was brought to the operating room and placed in the supine position on the operating room table. After adequate induction of general anesthesia the patient's bilateral chest and axillary areas were prepped with Betadine and draped in the usual sterile fashion. An appropriate timeout was performed. Attention was first turned to the right axilla. An elliptical incision was made around the area of the hidradenitis with a 15 blade knife. The incision was carried through the skin and subcutaneous tissue sharply with electrocautery. Once the damaged tissue was removed the wound was then closed with interrupted 3-0 nylon vertical mattress stitches. A couple pieces of Telfa were placed in between the stitches for drainage. The wound was then covered with antibiotic ointment and sterile dressings were applied. Attention was then turned to the left axilla. The abscess in the left axilla had spontaneously drained and the wound was opened. An incision was made around the open wound with a 15 blade knife. The incision was carried through the skin and subcutaneous tissue sharply with the electrocautery until the chronic abscess  cavity was removed. The wound was then packed with moistened Kerlix gauze and sterile dressings were applied. The patient tolerated the procedure well. At the end of the case all needle sponge and instrument counts were correct. The patient was then awakened and taken to recovery in stable condition.  PLAN OF CARE: Discharge to home after PACU  PATIENT DISPOSITION:  PACU - hemodynamically stable.   Delay start of Pharmacological VTE agent (>24hrs) due to surgical blood loss or risk of bleeding: not applicable

## 2015-04-12 NOTE — Telephone Encounter (Signed)
Patient has been referred to general surgery on 03/14/2015 for evaluation and treatment of Hidradenitis. Since the plan of care will be developed by the general surgery provider, any FMLA paperwork for this condition should be completed at that time. Treatment with appropriate follow up care will be established after evaluation and treatment of this condition. When referral was placed, patient voiced understanding and agreed to plan.

## 2015-04-12 NOTE — Anesthesia Procedure Notes (Signed)
Procedure Name: LMA Insertion Date/Time: 04/12/2015 3:18 PM Performed by: Burna CashONRAD, Shawndell Schillaci C Pre-anesthesia Checklist: Patient identified, Emergency Drugs available, Suction available and Patient being monitored Patient Re-evaluated:Patient Re-evaluated prior to inductionOxygen Delivery Method: Circle System Utilized Preoxygenation: Pre-oxygenation with 100% oxygen Intubation Type: IV induction Ventilation: Mask ventilation without difficulty LMA: LMA inserted LMA Size: 4.0 Number of attempts: 1 Airway Equipment and Method: bite block Placement Confirmation: positive ETCO2 Tube secured with: Tape Dental Injury: Teeth and Oropharynx as per pre-operative assessment

## 2015-04-12 NOTE — Transfer of Care (Signed)
Immediate Anesthesia Transfer of Care Note  Patient: Yolanda Richard  Procedure(s) Performed: Procedure(s): INCISION AND DRAINAGE BILATERAL HIDRADENITIS AXILLA (Bilateral)  Patient Location: PACU  Anesthesia Type:General  Level of Consciousness: sedated  Airway & Oxygen Therapy: Patient Spontanous Breathing and Patient connected to face mask oxygen  Post-op Assessment: Report given to RN and Post -op Vital signs reviewed and stable  Post vital signs: Reviewed and stable  Last Vitals:  Filed Vitals:   04/12/15 1343  BP: 127/83  Pulse: 59  Temp: 36.9 C  Resp: 18    Complications: No apparent anesthesia complications

## 2015-04-12 NOTE — Anesthesia Preprocedure Evaluation (Signed)
Anesthesia Evaluation  Patient identified by MRN, date of birth, ID band Patient awake    Reviewed: Allergy & Precautions, NPO status , Patient's Chart, lab work & pertinent test results  Airway Mallampati: II  TM Distance: >3 FB Neck ROM: Full    Dental no notable dental hx.    Pulmonary Current Smoker,    Pulmonary exam normal breath sounds clear to auscultation       Cardiovascular hypertension, Normal cardiovascular exam Rhythm:Regular Rate:Normal     Neuro/Psych Bipolar Disorder negative neurological ROS     GI/Hepatic negative GI ROS, Neg liver ROS,   Endo/Other  negative endocrine ROS  Renal/GU negative Renal ROS  negative genitourinary   Musculoskeletal negative musculoskeletal ROS (+)   Abdominal   Peds negative pediatric ROS (+)  Hematology negative hematology ROS (+)   Anesthesia Other Findings   Reproductive/Obstetrics negative OB ROS                             Anesthesia Physical Anesthesia Plan  ASA: II  Anesthesia Plan: General   Post-op Pain Management:    Induction: Intravenous  Airway Management Planned: LMA  Additional Equipment:   Intra-op Plan:   Post-operative Plan: Extubation in OR  Informed Consent: I have reviewed the patients History and Physical, chart, labs and discussed the procedure including the risks, benefits and alternatives for the proposed anesthesia with the patient or authorized representative who has indicated his/her understanding and acceptance.   Dental advisory given  Plan Discussed with: CRNA and Surgeon  Anesthesia Plan Comments:         Anesthesia Quick Evaluation

## 2015-04-12 NOTE — H&P (Signed)
Yolanda Richard  Location: [facility] Patient #: [id number] DOB: 1970-09-14 Single / Language: [place] / Race: Black or African American Female   History of Present Illness  The patient is a 45 year old female who presents for a follow-up for Subcutaneous abscess. The patient is a 45 year old white female who is about one month status post incision and drainage of a left axillary abscess from hidradenitis by Dr. Abbey Chatters. She was doing well but over the last week has had increasing pain in both axillas. She denies any drainage. She denies any fevers or chills.   Allergies  Flagyl *ANTI-INFECTIVE AGENTS - MISC.* Nausea, Vomiting. ACE Inhibitors No Known Drug Allergies02/02/2015 (Marked as Inactive)  Medication History  Doxycycline Hyclate (  Capsule, Oral) Active. BuPROPion HCl ER (XL) (  Tablet ER 24HR, Oral) Active. Hydrochlorothiazide (12.5MG  Capsule, Oral) Active. Medications Reconciled    Review of Systems  General Present- Appetite Loss, Chills and Weight Loss. Not Present- Fatigue, Fever, Night Sweats and Weight Gain. Skin Present- Non-Healing Wounds. Not Present- Change in Wart/Mole, Dryness, Hives, Jaundice, New Lesions, Rash and Ulcer. HEENT Present- Wears glasses/contact lenses. Not Present- Earache, Hearing Loss, Hoarseness, Nose Bleed, Oral Ulcers, Ringing in the Ears, Seasonal Allergies, Sinus Pain, Sore Throat, Visual Disturbances and Yellow Eyes. Respiratory Not Present- Bloody sputum, Chronic Cough, Difficulty Breathing, Snoring and Wheezing. Breast Present- Breast Pain. Not Present- Breast Mass, Nipple Discharge and Skin Changes. Cardiovascular Not Present- Chest Pain, Difficulty Breathing Lying Down, Leg Cramps, Palpitations, Rapid Heart Rate, Shortness of Breath and Swelling of Extremities. Gastrointestinal Present- Change in Bowel Habits. Not Present- Abdominal Pain, Bloating, Bloody Stool, Chronic diarrhea, Constipation,  Difficulty Swallowing, Excessive gas, Gets full quickly at meals, Hemorrhoids, Indigestion, Nausea, Rectal Pain and Vomiting. Female Genitourinary Not Present- Frequency, Nocturia, Painful Urination, Pelvic Pain and Urgency. Musculoskeletal Present- Back Pain, Joint Stiffness and Muscle Pain. Not Present- Joint Pain, Muscle Weakness and Swelling of Extremities. Neurological Present- Tingling and Weakness. Not Present- Decreased Memory, Fainting, Headaches, Numbness, Seizures, Tremor and Trouble walking. Psychiatric Present- Anxiety, Depression and Frequent crying. Not Present- Bipolar, Change in Sleep Pattern and Fearful. Endocrine Not Present- Cold Intolerance, Excessive Hunger, Hair Changes, Heat Intolerance, Hot flashes and New Diabetes. Hematology Not Present- Easy Bruising, Excessive bleeding, Gland problems, HIV and Persistent Infections.  Vitals Weight: 158 lb Height: 63in Body Surface Area: 1.75 m Body Mass Index: 27.99 kg/m  Temp.: 62F(Temporal)  Pulse: 76 (Regular)  BP: 124/80 (Sitting, Left Arm, Standard)       Physical Exam  General Mental Status-Alert. General Appearance-Consistent with stated age. Hydration-Well hydrated. Voice-Normal.  Integumentary Note: There is a small area of hidradenitis in the right axilla that does not appear to be acutely infected. There is no cellulitis. There is a larger area of induration measuring about 2 cm inferior to the open chronic healing area.   Head and Neck Head-normocephalic, atraumatic with no lesions or palpable masses. Trachea-midline. Thyroid Gland Characteristics - normal size and consistency.  Eye Eyeball - Bilateral-Extraocular movements intact. Sclera/Conjunctiva - Bilateral-No scleral icterus.  Chest and Lung Exam Chest and lung exam reveals -quiet, even and easy respiratory effort with no use of accessory muscles and on auscultation, normal breath sounds, no adventitious sounds  and normal vocal resonance. Inspection Chest Wall - Normal. Back - normal.  Cardiovascular Cardiovascular examination reveals -normal heart sounds, regular rate and rhythm with no murmurs and normal pedal pulses bilaterally.  Abdomen Inspection Inspection of the abdomen reveals - No Hernias. Skin - Scar - no  surgical scars. Palpation/Percussion Palpation and Percussion of the abdomen reveal - Soft, Non Tender, No Rebound tenderness, No Rigidity (guarding) and No hepatosplenomegaly. Auscultation Auscultation of the abdomen reveals - Bowel sounds normal.  Neurologic Neurologic evaluation reveals -alert and oriented x 3 with no impairment of recent or remote memory. Mental Status-Normal.  Musculoskeletal Normal Exam - Left-Upper Extremity Strength Normal and Lower Extremity Strength Normal. Normal Exam - Right-Upper Extremity Strength Normal and Lower Extremity Strength Normal.  Lymphatic Head & Neck  General Head & Neck Lymphatics: Bilateral - Description - Normal. Axillary  General Axillary Region: Bilateral - Description - Normal. Tenderness - Non Tender. Femoral & Inguinal  Generalized Femoral & Inguinal Lymphatics: Bilateral - Description - Normal. Tenderness - Non Tender.    Assessment & Plan  ABSCESS OF AXILLA, LEFT (L02.412) Impression: The patient is about a month status post incision and drainage of the left axilla for hidradenitis. Over the last week both axillas have flared up again. They are not getting better with antibiotics. At this point I think she would benefit from having both axillas incised and drained again. I have discussed with her in detail the risks and benefits of the operation to do this as well as some of the technical aspects and she understands and wishes to proceed. I will plan to do this on Friday. If she cannot make it to Friday then the next step would be to go to the emergency room and [facility]    Signed by  Caleen EssexPaul S Toth, MD

## 2015-04-12 NOTE — Anesthesia Postprocedure Evaluation (Signed)
Anesthesia Post Note  Patient: Yolanda Richard  Procedure(s) Performed: Procedure(s) (LRB): INCISION AND DRAINAGE BILATERAL HIDRADENITIS AXILLA (Bilateral)  Patient location during evaluation: PACU Anesthesia Type: General Level of consciousness: awake and alert Pain management: pain level controlled Vital Signs Assessment: post-procedure vital signs reviewed and stable Respiratory status: spontaneous breathing, nonlabored ventilation, respiratory function stable and patient connected to nasal cannula oxygen Cardiovascular status: blood pressure returned to baseline and stable Postop Assessment: no signs of nausea or vomiting Anesthetic complications: no    Last Vitals:  Filed Vitals:   04/12/15 1715 04/12/15 1730  BP: 174/82 176/91  Pulse: 86 89  Temp:    Resp: 16 13    Last Pain:  Filed Vitals:   04/12/15 1744  PainSc: 6                  Teandra Harlan S

## 2015-04-15 ENCOUNTER — Encounter (HOSPITAL_BASED_OUTPATIENT_CLINIC_OR_DEPARTMENT_OTHER): Payer: Self-pay | Admitting: General Surgery

## 2015-04-25 ENCOUNTER — Ambulatory Visit: Payer: Managed Care, Other (non HMO) | Admitting: Family Medicine

## 2015-04-29 ENCOUNTER — Ambulatory Visit (INDEPENDENT_AMBULATORY_CARE_PROVIDER_SITE_OTHER): Payer: Managed Care, Other (non HMO) | Admitting: Family Medicine

## 2015-04-29 ENCOUNTER — Other Ambulatory Visit (HOSPITAL_COMMUNITY)
Admission: RE | Admit: 2015-04-29 | Discharge: 2015-04-29 | Disposition: A | Discharge status: Home / Self Care | Payer: Managed Care, Other (non HMO) | Source: Ambulatory Visit | Attending: Family Medicine | Admitting: Family Medicine

## 2015-04-29 ENCOUNTER — Encounter: Payer: Self-pay | Admitting: Family Medicine

## 2015-04-29 VITALS — BP 120/80 | HR 78 | Temp 97.9°F | Wt 156.1 lb

## 2015-04-29 DIAGNOSIS — N760 Acute vaginitis: Secondary | ICD-10-CM | POA: Diagnosis present

## 2015-04-29 DIAGNOSIS — N3090 Cystitis, unspecified without hematuria: Secondary | ICD-10-CM | POA: Diagnosis not present

## 2015-04-29 DIAGNOSIS — Z113 Encounter for screening for infections with a predominantly sexual mode of transmission: Secondary | ICD-10-CM | POA: Diagnosis not present

## 2015-04-29 DIAGNOSIS — Z8619 Personal history of other infectious and parasitic diseases: Secondary | ICD-10-CM

## 2015-04-29 DIAGNOSIS — R300 Dysuria: Secondary | ICD-10-CM | POA: Diagnosis not present

## 2015-04-29 DIAGNOSIS — Z8742 Personal history of other diseases of the female genital tract: Secondary | ICD-10-CM | POA: Diagnosis not present

## 2015-04-29 LAB — POC URINALSYSI DIPSTICK (AUTOMATED)
GLUCOSE UA: NEGATIVE
Ketones, UA: NEGATIVE
Nitrite, UA: NEGATIVE
RBC UA: NEGATIVE
SPEC GRAV UA: 1.025
UROBILINOGEN UA: 1
pH, UA: 5.5

## 2015-04-29 MED ORDER — VALACYCLOVIR HCL 1 G PO TABS: 1000.0000 mg | ORAL_TABLET | ORAL | Status: DC | PRN

## 2015-04-29 MED ORDER — AMOXICILLIN-POT CLAVULANATE 875-125 MG PO TABS: 1.0000 | ORAL_TABLET | Freq: Two times a day (BID) | ORAL | Status: DC

## 2015-04-29 MED ORDER — FLUCONAZOLE 150 MG PO TABS: 150.0000 mg | ORAL_TABLET | Freq: Once | ORAL | Status: DC

## 2015-04-29 NOTE — Patient Instructions (Addendum)
Your will be contacted regarding your lab results within one week or sooner if needed. Please follow up with surgeon as indicated and follow up for a physical at your convenience or earlier if needed for any acute problems.

## 2015-04-29 NOTE — Progress Notes (Signed)
Subjective:    Patient ID: Yolanda Richard, female    DOB: [birth year], 45 y.o.   MRN: [id number]  HPI  Ms. Yolanda Richard is 45 year old who is here for dysuria and has requested STD screening today. She notes a new sexual partner and denies condom use. Associated symptom of burning with urination is noted. She denies frequency, hematuria, flank pain, vaginal discharge or pain with intercourse. History of hidradenitis suppurativa is noted and she is currently followed by general surgery for recent excision of hidradenitis in her axillae bilaterally. She has a follow up appointment with this office tomorrow and states that she is not experiencing any pain and is using Hibiclens as directed. She has completed antibiotic therapy of Bactrim DS and no longer requires pain medication for this issue.  HTN: She monitors her BP at home and notes that her readings have been in the 120s/80s without BP medication. She denies chest pain, palpitations, SOB, visual disturbances, or headaches.   Review of Systems  Constitutional: Negative for fever, chills and fatigue.  Respiratory: Negative for cough, shortness of breath and wheezing.   Cardiovascular: Negative for chest pain, palpitations and leg swelling.  Gastrointestinal: Negative for nausea, vomiting, abdominal pain, diarrhea, constipation and blood in stool.  Genitourinary: Positive for dysuria. Negative for urgency, frequency, hematuria, flank pain, vaginal discharge, genital sores, vaginal pain and pelvic pain.  Musculoskeletal: Negative for myalgias, joint swelling and arthralgias.  Skin: Positive for wound. Negative for rash.       Bilaterally axillae post Incision and drainage. Open wound on Left axillae and closed wound on right axillae  Allergic/Immunologic: Negative for environmental allergies.  Neurological: Negative for dizziness, light-headedness, numbness and headaches.   Past Medical History  Diagnosis Date  . Preterm labor   .  Hypertension   . Trichomonas   . Hidradenitis suppurativa   . Asthma   . Anxiety   . Anemia   . Bipolar disorder (HCC)     no meds  . Depression     no meds    Social History   Social History  . Marital Status: Divorced    Spouse Name: N/A  . Number of Children: N/A  . Years of Education: N/A   Occupational History  . Not on file.   Social History Main Topics  . Smoking status: Current Every Day Smoker -- 1.00 packs/day for 16 years    Types: Cigarettes  . Smokeless tobacco: Never Used  . Alcohol Use: Yes     Comment: occasional  . Drug Use: No  . Sexual Activity: Yes    Birth Control/ Protection: Condom, Surgical   Other Topics Concern  . Not on file   Social History Narrative    Past Surgical History  Procedure Laterality Date  . Abdominal hysterectomy    . Mouth surgery    . Hydradenitis excision Right 12/07/2013    Procedure: EXCISION HIDRADENITIS AXILLA right axilla abscess ;  Surgeon: Emelia LoronMatthew Wakefield, MD;  Location: [facility];  Service: General;  Laterality: Right;  . Incision and drainage abscess Left 02/23/2015    Procedure: INCISION AND DRAINAGE ABSCESS;  Surgeon: Avel Peaceodd Rosenbower, MD;  Location: [facility];  Service: General;  Laterality: Left;  . Hydradenitis excision Bilateral 04/12/2015    Procedure: INCISION AND DRAINAGE BILATERAL HIDRADENITIS AXILLA;  Surgeon: Chevis PrettyPaul Toth III, MD;  Location: [facility];  Service: General;  Laterality: Bilateral;    Family History  Problem Relation Age of Onset  . Other Neg  Hx   . Cancer Mother   . Cancer Maternal Aunt   . Stroke Paternal Uncle   . Diabetes Paternal Uncle   . Cancer Maternal Grandmother   . Cancer Paternal Grandmother     Allergies  Allergen Reactions  . Ace Inhibitors Swelling    angioedema  . Flagyl [Metronidazole] Nausea And Vomiting    Current Outpatient Prescriptions on File Prior to Visit  Medication Sig Dispense Refill  . amLODipine (NORVASC) 2.5 MG tablet Take 1 tablet  (2.5 mg total) by mouth daily. 30 tablet 1  . clindamycin (CLEOCIN T) 1 % lotion Apply topically 2 (two) times daily. 60 mL 0  . hydrochlorothiazide (MICROZIDE) 12.5 MG capsule Take 1 capsule (12.5 mg total) by mouth daily. 30 capsule 0  . oxyCODONE (OXY IR/ROXICODONE) 5 MG immediate release tablet Take 1-2 tablets (5-10 mg total) by mouth every 4 (four) hours as needed for moderate pain. 30 tablet 0  . oxyCODONE-acetaminophen (ROXICET) 5-325 MG tablet Take 1-2 tablets by mouth every 4 (four) hours as needed. 50 tablet 0  . sulfamethoxazole-trimethoprim (BACTRIM DS,SEPTRA DS) 800-160 MG tablet Take 1 tablet by mouth 2 (two) times daily.     No current facility-administered medications on file prior to visit.    BP 120/80 mmHg  Pulse 78  Temp(Src) 97.9 F (36.6 C) (Oral)  Wt 156 lb 1.6 oz (70.806 kg)       Objective:   Physical Exam  Constitutional: She is oriented to person, place, and time. She appears well-developed and well-nourished.  Cardiovascular: Normal rate and regular rhythm.   Pulmonary/Chest: Effort normal and breath sounds normal. She has no wheezes. She has no rales.  Lymphadenopathy:    She has no cervical adenopathy.  Neurological: She is alert and oriented to person, place, and time.  Skin: Skin is warm and dry.  Patient stated that she did not want me to evaluate her axillae as she has a follow up appointment with her surgeon tomorrow.  Observation of the area indicates a closed, approximated incision in the right axillary area without redness or drainage noted. Left axillae has an open wound that is pink, moist, and without s/sx of infection noted. She declined photos and requested only STD screening today.  Psychiatric: She has a normal mood and affect. Her behavior is normal.       Assessment & Plan:   1. Cystitis Recent therapy of Bactrim post Incision and Drainage of Hidradenitis has been completed. Due to this recent therapy, Augmentin has been chosen.  Urine culture pending Provided patient with diflucan due to previous history of candida infection with antibiotic use.  - amoxicillin-clavulanate (AUGMENTIN) 875-125 MG tablet; Take 1 tablet by mouth 2 (two) times daily.  Dispense: 14 tablet; Refill: 0  2. Dysuria  - POCT Urinalysis Dipstick (Automated) - Urine culture  3. Screening for STD (sexually transmitted disease)  - Urine cytology ancillary only - Culture, Urine  4. History of candidal vulvovaginitis  - fluconazole (DIFLUCAN) 150 MG tablet; Take 1 tablet (150 mg total) by mouth once.  Dispense: 1 tablet; Refill: 0  Follow up with surgeon tomorrow as indicated and routine wellness will be scheduled by patient.

## 2015-04-29 NOTE — Progress Notes (Signed)
Pre visit review using our clinic review tool, if applicable. No additional management support is needed unless otherwise documented below in the visit note. 

## 2015-04-30 LAB — URINE CULTURE
Colony Count: NO GROWTH
ORGANISM ID, BACTERIA: NO GROWTH

## 2015-05-01 LAB — URINE CYTOLOGY ANCILLARY ONLY
CHLAMYDIA, DNA PROBE: NEGATIVE
NEISSERIA GONORRHEA: NEGATIVE
TRICH (WINDOWPATH): POSITIVE — AB

## 2015-05-02 ENCOUNTER — Telehealth: Payer: Self-pay | Admitting: Family Medicine

## 2015-05-02 DIAGNOSIS — R110 Nausea: Secondary | ICD-10-CM

## 2015-05-02 DIAGNOSIS — R112 Nausea with vomiting, unspecified: Secondary | ICD-10-CM

## 2015-05-02 DIAGNOSIS — A599 Trichomoniasis, unspecified: Secondary | ICD-10-CM

## 2015-05-02 LAB — URINE CYTOLOGY ANCILLARY ONLY
BACTERIAL VAGINITIS: POSITIVE — AB
Candida vaginitis: NEGATIVE

## 2015-05-02 MED ORDER — ONDANSETRON HCL 8 MG PO TABS: 8.0000 mg | ORAL_TABLET | Freq: Three times a day (TID) | ORAL | Status: DC | PRN

## 2015-05-02 MED ORDER — METRONIDAZOLE 500 MG PO TABS: ORAL_TABLET | ORAL | Status: DC

## 2015-05-02 NOTE — Telephone Encounter (Signed)
Urine culture displayed no growth for cystitis so discontinue Augmentin. Positive for trichomoniasis. Metronidazole has been prescribed with zofran for nausea as this class of medications is the curative treatment. Patient is advised to abstain from alcohol while taking this medication and abstain from intercourse for one week and sexual partner should seek treatment.

## 2015-05-02 NOTE — Telephone Encounter (Signed)
Spoke with pt and she is aware.

## 2015-09-30 ENCOUNTER — Ambulatory Visit (INDEPENDENT_AMBULATORY_CARE_PROVIDER_SITE_OTHER): Payer: Managed Care, Other (non HMO) | Admitting: Family Medicine

## 2015-09-30 ENCOUNTER — Encounter: Payer: Self-pay | Admitting: Family Medicine

## 2015-09-30 VITALS — BP 120/70 | HR 73 | Resp 12 | Ht 63.0 in | Wt 155.2 lb

## 2015-09-30 DIAGNOSIS — Z7251 High risk heterosexual behavior: Secondary | ICD-10-CM | POA: Diagnosis not present

## 2015-09-30 DIAGNOSIS — I498 Other specified cardiac arrhythmias: Secondary | ICD-10-CM | POA: Diagnosis not present

## 2015-09-30 NOTE — Progress Notes (Signed)
HPI:  ACUTE VISIT:  Chief Complaint  Patient presents with  . std testing    Patient wants std testing, patient isn't having any pain    [name] is a 45 y.o. female, who is here today requesting STD testing.  She had some STD screening in 03/2014. She denies any symptoms but thinks her sex partner is "cheating". She already had HIV test done this year.  Not sure about hepatitis B vaccination. HCV negative 03/2014. Trichomonal vaginitis about 04/2013, she had symptoms at the time of the Dx. 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  She does not use condoms. S/P hysterectomy.  Hx of anxiety and depression.   Lab Results  Component Value Date   CREATININE 0.91 04/10/2015   BUN 6 04/10/2015   NA 136 04/10/2015   K 3.8 04/10/2015   CL 101 04/10/2015   CO2 23 04/10/2015    During examination I noted mildly irregular heart rate and mild bradycardia. She denies any known history of heart disease, she denies any symptom. History of hypertension, currently she is on amlodipine and hydrochlorthiazide, she is following periodically with PCP.   Denies severe/frequent headache, visual changes, chest pain, dyspnea, palpitation, claudication, focal weakness, or edema.   Review of Systems  Constitutional: Negative for appetite change, fatigue, fever and unexpected weight change.  HENT: Negative for mouth sores, nosebleeds and trouble swallowing.   Eyes: Negative for discharge, redness and visual disturbance.  Respiratory: Negative for cough, shortness of breath and wheezing.   Cardiovascular: Negative for chest pain, palpitations and leg swelling.  Gastrointestinal: Negative for abdominal pain, nausea and vomiting.       Negative for changes in bowel habits.  Genitourinary: Negative for difficulty urinating, dyspareunia, dysuria, genital sores, hematuria, vaginal bleeding and vaginal discharge.  Musculoskeletal: Negative for arthralgias, joint  swelling and myalgias.  Skin: Negative for rash and wound.  Neurological: Negative for tremors, seizures, syncope, weakness, numbness and headaches.  Hematological: Negative for adenopathy.  Psychiatric/Behavioral: Negative for confusion. The patient is nervous/anxious.       Current Outpatient Prescriptions on File Prior to Visit  Medication Sig Dispense Refill  . amLODipine (NORVASC) 2.5 MG tablet Take 1 tablet (2.5 mg total) by mouth daily. 30 tablet 1  . clindamycin (CLEOCIN T) 1 % lotion Apply topically 2 (two) times daily. 60 mL 0  . hydrochlorothiazide (MICROZIDE) 12.5 MG capsule Take 1 capsule (12.5 mg total) by mouth daily. 30 capsule 0  . ondansetron (ZOFRAN) 8 MG tablet Take 1 tablet (8 mg total) by mouth every 8 (eight) hours as needed for nausea or vomiting. 20 tablet 0  . oxyCODONE-acetaminophen (ROXICET) 5-325 MG tablet Take 1-2 tablets by mouth every 4 (four) hours as needed. 50 tablet 0  . valACYclovir (VALTREX) 1000 MG tablet Take 1 tablet (1,000 mg total) by mouth as needed. 21 tablet 2   No current facility-administered medications on file prior to visit.      Past Medical History:  Diagnosis Date  . Anemia   . Anxiety   . Asthma   . Bipolar disorder (HCC)    no meds  . Depression    no meds  . Hidradenitis suppurativa   . Hypertension   . Preterm labor   . Trichomonas    Allergies  Allergen Reactions  . Ace Inhibitors Swelling    angioedema  . Flagyl [Metronidazole] Nausea And Vomiting    Social History   Social History  .  Marital status: Divorced    Spouse name: N/A  . Number of children: N/A  . Years of education: N/A   Social History Main Topics  . Smoking status: Current Every Day Smoker    Packs/day: 1.00    Years: 16.00    Types: Cigarettes  . Smokeless tobacco: Never Used  . Alcohol use Yes     Comment: occasional  . Drug use: No  . Sexual activity: Yes    Birth control/ protection: Condom, Surgical   Other Topics Concern  .  None   Social History Narrative  . None    Vitals:   09/30/15 1512  BP: 120/70  Pulse: 73  Resp: 12   O2 sat 97% at RA.  Body mass index is 27.5 kg/m.      Physical Exam  Nursing note and vitals reviewed. Constitutional: She is oriented to person, place, and time. She appears well-developed. No distress.  HENT:  Head: Atraumatic.  Mouth/Throat: Oropharynx is clear and moist and mucous membranes are normal.  Eyes: Conjunctivae are normal.  Cardiovascular: An irregular rhythm present.  Occasional extrasystoles (x 2 in a min) are present. Bradycardia present.   No murmur heard. HR by my count 58/min, mildly irregular  Respiratory: Effort normal and breath sounds normal. No respiratory distress.  GI: Soft. She exhibits no mass. There is no hepatomegaly. There is no tenderness.  Musculoskeletal: She exhibits no edema.  Neurological: She is alert and oriented to person, place, and time. She has normal strength. Coordination and gait normal.  Skin: Skin is warm. No rash noted. No erythema.  Psychiatric: Her speech is normal. Her mood appears not anxious. Her affect is blunt.  Well groomed, good eye contact.      ASSESSMENT AND PLAN:     Yolanda Richard was seen today for std testing.  Diagnoses and all orders for this visit:  High risk sexual behavior  Asymptomatic. Not sure about exposure. Education about STD's prevention discussed. She prefers urine test and requesting CT,GC,and trich. HIV reported as done. If Hep B neg, vaccine will be recommended.   -     Hep B Surface Antibody -     Hep B Surface Antigen -     RPR -     Chlamydia/Gonococcus/Trichomonas, NAA  Sinus arrhythmia   Reviewing prior EKG's, 02/2015 EKG with sinus arrhythmia. Since she is asymptomatic, no further work-up recommended today. Instructed about warning signs. Keep f/u appt with PCP to follow on HTN.          -[name] advised to return or notify a doctor  immediately if symptoms worsen or persist or new concerns arise.       Yolanda Richard G. [place], MD  [facility]. [place] office.

## 2015-09-30 NOTE — Patient Instructions (Addendum)
  Yolanda Richard I have seen you today for an acute visit because your primary care provider was not available.   1. High risk sexual behavior  I used this diagnosis for lab order purposes.   - Hep B Surface Antibody - Hep B Surface Antigen - RPR - Chlamydia/Gonococcus/Trichomonas, NAA  2. Sinus arrhythmia    Condom use prevents STD's.  If symptoms, you will need pelvic examination.  EKG in 02/2015 sinus arrhythmia, so no further study today.  Smoking cessation strongly recommended.   Please be sure you have an appointment already scheduled with your PCP.

## 2015-09-30 NOTE — Progress Notes (Signed)
Pre visit review using our clinic review tool, if applicable. No additional management support is needed unless otherwise documented below in the visit note. 

## 2015-10-01 ENCOUNTER — Encounter: Payer: Self-pay | Admitting: Family Medicine

## 2015-10-01 LAB — HEPATITIS B SURFACE ANTIGEN: Hepatitis B Surface Ag: NEGATIVE

## 2015-10-01 LAB — HEPATITIS B SURFACE ANTIBODY,QUALITATIVE: Hep B S Ab: POSITIVE — AB

## 2015-10-01 LAB — RPR

## 2015-10-17 ENCOUNTER — Ambulatory Visit: Payer: Managed Care, Other (non HMO) | Admitting: Family Medicine

## 2015-10-30 ENCOUNTER — Encounter: Payer: Self-pay | Admitting: Student

## 2015-11-01 ENCOUNTER — Encounter: Payer: Self-pay | Admitting: Family Medicine

## 2016-04-27 ENCOUNTER — Other Ambulatory Visit (HOSPITAL_COMMUNITY)
Admission: RE | Admit: 2016-04-27 | Discharge: 2016-04-27 | Disposition: A | Discharge status: Home / Self Care | Payer: Managed Care, Other (non HMO) | Source: Ambulatory Visit | Attending: Family Medicine | Admitting: Family Medicine

## 2016-04-27 ENCOUNTER — Ambulatory Visit (INDEPENDENT_AMBULATORY_CARE_PROVIDER_SITE_OTHER): Payer: Self-pay | Admitting: Family Medicine

## 2016-04-27 ENCOUNTER — Encounter: Payer: Self-pay | Admitting: Family Medicine

## 2016-04-27 ENCOUNTER — Other Ambulatory Visit: Payer: Self-pay

## 2016-04-27 VITALS — BP 128/90 | HR 66 | Temp 98.1°F | Wt 158.0 lb

## 2016-04-27 DIAGNOSIS — Z1239 Encounter for other screening for malignant neoplasm of breast: Secondary | ICD-10-CM

## 2016-04-27 DIAGNOSIS — Z0000 Encounter for general adult medical examination without abnormal findings: Secondary | ICD-10-CM

## 2016-04-27 DIAGNOSIS — I499 Cardiac arrhythmia, unspecified: Secondary | ICD-10-CM

## 2016-04-27 DIAGNOSIS — L732 Hidradenitis suppurativa: Secondary | ICD-10-CM

## 2016-04-27 DIAGNOSIS — N760 Acute vaginitis: Secondary | ICD-10-CM | POA: Insufficient documentation

## 2016-04-27 DIAGNOSIS — I10 Essential (primary) hypertension: Secondary | ICD-10-CM

## 2016-04-27 DIAGNOSIS — B373 Candidiasis of vulva and vagina: Secondary | ICD-10-CM | POA: Insufficient documentation

## 2016-04-27 DIAGNOSIS — Z124 Encounter for screening for malignant neoplasm of cervix: Secondary | ICD-10-CM

## 2016-04-27 DIAGNOSIS — Z1231 Encounter for screening mammogram for malignant neoplasm of breast: Secondary | ICD-10-CM

## 2016-04-27 DIAGNOSIS — Z113 Encounter for screening for infections with a predominantly sexual mode of transmission: Secondary | ICD-10-CM

## 2016-04-27 DIAGNOSIS — I498 Other specified cardiac arrhythmias: Secondary | ICD-10-CM

## 2016-04-27 LAB — CBC WITH DIFFERENTIAL/PLATELET
BASOS ABS: 0.1 10*3/uL (ref 0.0–0.1)
Basophils Relative: 0.6 % (ref 0.0–3.0)
EOS ABS: 0.3 10*3/uL (ref 0.0–0.7)
Eosinophils Relative: 2.8 % (ref 0.0–5.0)
HCT: 42.3 % (ref 36.0–46.0)
HEMOGLOBIN: 14.1 g/dL (ref 12.0–15.0)
LYMPHS PCT: 28.7 % (ref 12.0–46.0)
Lymphs Abs: 3.1 10*3/uL (ref 0.7–4.0)
MCHC: 33.4 g/dL (ref 30.0–36.0)
MCV: 88.8 fl (ref 78.0–100.0)
MONO ABS: 0.5 10*3/uL (ref 0.1–1.0)
Monocytes Relative: 4.2 % (ref 3.0–12.0)
Neutro Abs: 7 10*3/uL (ref 1.4–7.7)
Neutrophils Relative %: 63.7 % (ref 43.0–77.0)
Platelets: 296 10*3/uL (ref 150.0–400.0)
RBC: 4.76 Mil/uL (ref 3.87–5.11)
RDW: 14.5 % (ref 11.5–15.5)
WBC: 11 10*3/uL — AB (ref 4.0–10.5)

## 2016-04-27 LAB — BASIC METABOLIC PANEL
BUN: 9 mg/dL (ref 6–23)
CO2: 30 mEq/L (ref 19–32)
Calcium: 9.7 mg/dL (ref 8.4–10.5)
Chloride: 106 mEq/L (ref 96–112)
Creatinine, Ser: 0.9 mg/dL (ref 0.40–1.20)
GFR: 87.47 mL/min (ref >60.00)
GLUCOSE: 112 mg/dL — AB (ref 70–99)
POTASSIUM: 4.2 meq/L (ref 3.5–5.1)
SODIUM: 140 meq/L (ref 135–145)

## 2016-04-27 LAB — HEPATIC FUNCTION PANEL
ALT: 11 U/L (ref 0–35)
AST: 12 U/L (ref 0–37)
Albumin: 4.3 g/dL (ref 3.5–5.2)
Alkaline Phosphatase: 60 U/L (ref 39–117)
Bilirubin, Direct: 0.1 mg/dL (ref 0.0–0.3)
Total Bilirubin: 0.4 mg/dL (ref 0.2–1.2)
Total Protein: 7.1 g/dL (ref 6.0–8.3)

## 2016-04-27 LAB — HEMOGLOBIN A1C: HEMOGLOBIN A1C: 6.6 % — AB (ref 4.6–6.5)

## 2016-04-27 LAB — LIPID PANEL
CHOL/HDL RATIO: 3
CHOLESTEROL: 176 mg/dL (ref 0–200)
HDL: 64.6 mg/dL (ref >39.00)
LDL CALC: 72 mg/dL (ref 0–99)
NONHDL: 111.56
Triglycerides: 198 mg/dL — ABNORMAL HIGH (ref 0.0–149.0)
VLDL: 39.6 mg/dL (ref 0.0–40.0)

## 2016-04-27 LAB — TSH: TSH: 0.48 u[IU]/mL (ref 0.35–4.50)

## 2016-04-27 MED ORDER — FLUCONAZOLE 150 MG PO TABS: 150.0000 mg | ORAL_TABLET | Freq: Once | ORAL | 0 refills | Status: AC

## 2016-04-27 MED ORDER — VALACYCLOVIR HCL 1 G PO TABS: 1000.0000 mg | ORAL_TABLET | ORAL | 2 refills | Status: AC | PRN

## 2016-04-27 NOTE — Patient Instructions (Addendum)
It was a pleasure to see you today! We have ordered labs or studies at this visit. It can take up to 1-2 weeks for results and processing. IF results require follow up or explanation, we will call you with instructions. Clinically stable results will be released to your [facility]. If you have not heard from [place] or cannot find your results in [facility] in 2 weeks please contact our office at [phone].  If you are not yet signed up for [facility], please consider signing up   Also, please continue decreasing cigarettes which will help you overall for your health. Please monitor blood pressure and follow up for further evaluation if readings are elevated. Minimal Blood Pressure Goal= AVERAGE < 140/90; Ideal is an AVERAGE < 135/85. This AVERAGE should be calculated from @ least 5-7 BP readings taken @ different times of day on different days of week. You should not respond to isolated BP readings , but rather the AVERAGE for that week .Please bring your blood pressure cuff to office visits to verify that it is reliable.It can also be checked against the blood pressure device at the pharmacy. Finger or wrist cuffs are not dependable; an arm cuff is. Health Maintenance, Female Adopting a healthy lifestyle and getting preventive care can go a long way to promote health and wellness. Talk with your health care provider about what schedule of regular examinations is right for you. This is a good chance for you to check in with your provider about disease prevention and staying healthy. In between checkups, there are plenty of things you can do on your own. Experts have done a lot of research about which lifestyle changes and preventive measures are most likely to keep you healthy. Ask your health care provider for more information. Weight and diet Eat a healthy diet  Be sure to include plenty of vegetables, fruits, low-fat dairy products, and lean protein.  Do not eat a lot of foods high in solid fats, added  sugars, or salt.  Get regular exercise. This is one of the most important things you can do for your health.  Most adults should exercise for at least 150 minutes each week. The exercise should increase your heart rate and make you sweat (moderate-intensity exercise).  Most adults should also do strengthening exercises at least twice a week. This is in addition to the moderate-intensity exercise. Maintain a healthy weight  Body mass index (BMI) is a measurement that can be used to identify possible weight problems. It estimates body fat based on height and weight. Your health care provider can help determine your BMI and help you achieve or maintain a healthy weight.  For females 19 years of age and older:  A BMI below 18.5 is considered underweight.  A BMI of 18.5 to 24.9 is normal.  A BMI of 25 to 29.9 is considered overweight.  A BMI of 30 and above is considered obese. Watch levels of cholesterol and blood lipids  You should start having your blood tested for lipids and cholesterol at 44 years of age, then have this test every 5 years.  You may need to have your cholesterol levels checked more often if:  Your lipid or cholesterol levels are high.  You are older than 44 years of age.  You are at high risk for heart disease. Cancer screening Lung Cancer  Lung cancer screening is recommended for adults 74-69 years old who are at high risk for lung cancer because of a history of smoking.  A yearly low-dose CT scan of the lungs is recommended for people who:  Currently smoke.  Have quit within the past 15 years.  Have at least a 30-pack-year history of smoking. A pack year is smoking an average of one pack of cigarettes a day for 1 year.  Yearly screening should continue until it has been 15 years since you quit.  Yearly screening should stop if you develop a health problem that would prevent you from having lung cancer treatment. Breast Cancer  Practice breast  self-awareness. This means understanding how your breasts normally appear and feel.  It also means doing regular breast self-exams. Let your health care provider know about any changes, no matter how small.  If you are in your 20s or 30s, you should have a clinical breast exam (CBE) by a health care provider every 1-3 years as part of a regular health exam.  If you are 67 or older, have a CBE every year. Also consider having a breast X-ray (mammogram) every year.  If you have a family history of breast cancer, talk to your health care provider about genetic screening.  If you are at high risk for breast cancer, talk to your health care provider about having an MRI and a mammogram every year.  Breast cancer gene (BRCA) assessment is recommended for women who have family members with BRCA-related cancers. BRCA-related cancers include:  Breast.  Ovarian.  Tubal.  Peritoneal cancers.  Results of the assessment will determine the need for genetic counseling and BRCA1 and BRCA2 testing. Cervical Cancer  Your health care provider may recommend that you be screened regularly for cancer of the pelvic organs (ovaries, uterus, and vagina). This screening involves a pelvic examination, including checking for microscopic changes to the surface of your cervix (Pap test). You may be encouraged to have this screening done every 3 years, beginning at age 64.  For women ages 20-65, health care providers may recommend pelvic exams and Pap testing every 3 years, or they may recommend the Pap and pelvic exam, combined with testing for human papilloma virus (HPV), every 5 years. Some types of HPV increase your risk of cervical cancer. Testing for HPV may also be done on women of any age with unclear Pap test results.  Other health care providers may not recommend any screening for nonpregnant women who are considered low risk for pelvic cancer and who do not have symptoms. Ask your health care provider if a  screening pelvic exam is right for you.  If you have had past treatment for cervical cancer or a condition that could lead to cancer, you need Pap tests and screening for cancer for at least 20 years after your treatment. If Pap tests have been discontinued, your risk factors (such as having a new sexual partner) need to be reassessed to determine if screening should resume. Some women have medical problems that increase the chance of getting cervical cancer. In these cases, your health care provider may recommend more frequent screening and Pap tests. Colorectal Cancer  This type of cancer can be detected and often prevented.  Routine colorectal cancer screening usually begins at 44 years of age and continues through 44 years of age.  Your health care provider may recommend screening at an earlier age if you have risk factors for colon cancer.  Your health care provider may also recommend using home test kits to check for hidden blood in the stool.  A small camera at the end of a  tube can be used to examine your colon directly (sigmoidoscopy or colonoscopy). This is done to check for the earliest forms of colorectal cancer.  Routine screening usually begins at age 81.  Direct examination of the colon should be repeated every 5-10 years through 44 years of age. However, you may need to be screened more often if early forms of precancerous polyps or small growths are found. Skin Cancer  Check your skin from head to toe regularly.  Tell your health care provider about any new moles or changes in moles, especially if there is a change in a mole's shape or color.  Also tell your health care provider if you have a mole that is larger than the size of a pencil eraser.  Always use sunscreen. Apply sunscreen liberally and repeatedly throughout the day.  Protect yourself by wearing long sleeves, pants, a wide-brimmed hat, and sunglasses whenever you are outside. Heart disease, diabetes, and high  blood pressure  High blood pressure causes heart disease and increases the risk of stroke. High blood pressure is more likely to develop in:  People who have blood pressure in the high end of the normal range (130-139/85-89 mm Hg).  People who are overweight or obese.  People who are African American.  If you are 47-73 years of age, have your blood pressure checked every 3-5 years. If you are 95 years of age or older, have your blood pressure checked every year. You should have your blood pressure measured twice-once when you are at a hospital or clinic, and once when you are not at a hospital or clinic. Record the average of the two measurements. To check your blood pressure when you are not at a hospital or clinic, you can use:  An automated blood pressure machine at a pharmacy.  A home blood pressure monitor.  If you are between 81 years and 40 years old, ask your health care provider if you should take aspirin to prevent strokes.  Have regular diabetes screenings. This involves taking a blood sample to check your fasting blood sugar level.  If you are at a normal weight and have a low risk for diabetes, have this test once every three years after 44 years of age.  If you are overweight and have a high risk for diabetes, consider being tested at a younger age or more often. Preventing infection Hepatitis B  If you have a higher risk for hepatitis B, you should be screened for this virus. You are considered at high risk for hepatitis B if:  You were born in a country where hepatitis B is common. Ask your health care provider which countries are considered high risk.  Your parents were born in a high-risk country, and you have not been immunized against hepatitis B (hepatitis B vaccine).  You have HIV or AIDS.  You use needles to inject street drugs.  You live with someone who has hepatitis B.  You have had sex with someone who has hepatitis B.  You get hemodialysis  treatment.  You take certain medicines for conditions, including cancer, organ transplantation, and autoimmune conditions. Hepatitis C  Blood testing is recommended for:  Everyone born from [birth year] through 1965.  Anyone with known risk factors for hepatitis C. Sexually transmitted infections (STIs)  You should be screened for sexually transmitted infections (STIs) including gonorrhea and chlamydia if:  You are sexually active and are younger than 44 years of age.  You are older than 44 years of age  and your health care provider tells you that you are at risk for this type of infection.  Your sexual activity has changed since you were last screened and you are at an increased risk for chlamydia or gonorrhea. Ask your health care provider if you are at risk.  If you do not have HIV, but are at risk, it may be recommended that you take a prescription medicine daily to prevent HIV infection. This is called pre-exposure prophylaxis (PrEP). You are considered at risk if:  You are sexually active and do not regularly use condoms or know the HIV status of your partner(s).  You take drugs by injection.  You are sexually active with a partner who has HIV. Talk with your health care provider about whether you are at high risk of being infected with HIV. If you choose to begin PrEP, you should first be tested for HIV. You should then be tested every 3 months for as long as you are taking PrEP. Pregnancy  If you are premenopausal and you may become pregnant, ask your health care provider about preconception counseling.  If you may become pregnant, take 400 to 800 micrograms (mcg) of folic acid every day.  If you want to prevent pregnancy, talk to your health care provider about birth control (contraception). Osteoporosis and menopause  Osteoporosis is a disease in which the bones lose minerals and strength with aging. This can result in serious bone fractures. Your risk for osteoporosis can be  identified using a bone density scan.  If you are 6 years of age or older, or if you are at risk for osteoporosis and fractures, ask your health care provider if you should be screened.  Ask your health care provider whether you should take a calcium or vitamin D supplement to lower your risk for osteoporosis.  Menopause may have certain physical symptoms and risks.  Hormone replacement therapy may reduce some of these symptoms and risks. Talk to your health care provider about whether hormone replacement therapy is right for you. Follow these instructions at home:  Schedule regular health, dental, and eye exams.  Stay current with your immunizations.  Do not use any tobacco products including cigarettes, chewing tobacco, or electronic cigarettes.  If you are pregnant, do not drink alcohol.  If you are breastfeeding, limit how much and how often you drink alcohol.  Limit alcohol intake to no more than 1 drink per day for nonpregnant women. One drink equals 12 ounces of beer, 5 ounces of wine, or 1 ounces of hard liquor.  Do not use street drugs.  Do not share needles.  Ask your health care provider for help if you need support or information about quitting drugs.  Tell your health care provider if you often feel depressed.  Tell your health care provider if you have ever been abused or do not feel safe at home. This information is not intended to replace advice given to you by your health care provider. Make sure you discuss any questions you have with your health care provider. Document Released: 08/04/2010 Document Revised: 06/27/2015 Document Reviewed: 10/23/2014 Elsevier Interactive Patient Education  2017 [employer] FAST TRACK!!!  SAME DAY Appointments for ACUTE CARE  Such as: Sprains, Injuries, cuts, abrasions, rashes, muscle pain, joint pain, back pain Colds, flu, sore throats, headache, allergies, cough, fever  Ear  pain, sinus and eye infections Abdominal pain, nausea, vomiting, diarrhea, upset stomach Animal/insect bites  3  Easy Ways to Schedule: Walk-In Scheduling Call in scheduling Mychart Sign-up: [email or web address]

## 2016-04-27 NOTE — Progress Notes (Signed)
Subjective:    Patient ID: Yolanda Richard, female    DOB: Jul 13, 1970, 46 y.o.   MRN: [id number]  HPI  Yolanda Richard is here today for routine physical exam.  She is a smoker and reports feeling well today.  History of HTN, hidradenitis suppurativa, and high risk sexual behavior.  She was evaluated for STI testing in 09/2015 which indicated that she was negative for chlamydia, gonorrhea, trichomoniasis, syphilis, and acute hepatitis B.  She was positive for hepatitis B antibodies so vaccination is not needed. HCV negative in 2017 and HSV Ab positive but she has never had a genital lesion. She does not use condoms and is S/P hysterectomy. She denies any new sexual partners but is requesting testing for STDs and HIV today as she reports doing this at her yearly exams.  HTN:  She has been prescribed hydrochlorothiazide 12.5 mg and amlodipine 2.5 mg was added on 03/28/15.  She reports systolic readings of 120s systolic average, and 70s diastolic averages however denies taking medications that were prescribed for her. She has removed pork from her diet and is exercising 45 min/4 times a week without cardiopulmonary symptoms.  She is smoking 10-12 cigarettes/day which has decreased from 1 ppd. She seeks care periodically and has not been back for a follow up for HTN since that time. She also reports that she does not want to take blood pressure medication and feels she does not need it as she has increased her exercise regimen.  History of hidradenitis suppurativa where she has been followed by [facility] previously.  A referral to a general surgeon was placed in 03/2015 for treatment options for history of multiple reoccurrences however she has not seen a surgeon since last treatment for hidradenitis.  Problem areas of axillae and groin are of concern for this patient.  She reports being well after last treatment one year ago and relates this to a change in her diet.  On 09/30/2015 an  incidental finding of a sinus arrhythmia was noted.  EKG completed and compared to 02/2015 noted sinus arrhythmia.  She was asymptomatic and no further work-up was recommended at that time.  Sinus arrhythmia noted at HR of 58/min, and mildly irregular with mild bradycardia.  She has not known history of heart disease.   Td is UTD and due in 2019, she declines influenza vaccine today. Vision is UTD; new glasses Dental is UTD; no concerns   Review of Systems  Constitutional: Negative for chills, fatigue and fever.  HENT: Negative for congestion, ear pain, postnasal drip, rhinorrhea, sinus pain, sinus pressure, sneezing and sore throat.   Eyes: Negative for pain, redness and visual disturbance.  Respiratory: Negative for cough, shortness of breath and wheezing.   Cardiovascular: Negative for chest pain and palpitations.  Gastrointestinal: Negative for abdominal pain, blood in stool, constipation, diarrhea, nausea and vomiting.  Genitourinary: Negative for dysuria, flank pain, frequency, genital sores, hematuria and urgency.  Musculoskeletal: Negative for myalgias.  Skin: Negative for rash.  Neurological: Negative for dizziness, light-headedness, numbness and headaches.  Psychiatric/Behavioral:       Denies depressed or anxious mood. She has not seen her therapist in a year.   Past Medical History:  Diagnosis Date  . Anemia   . Anxiety   . Asthma   . Bipolar disorder (HCC)    no meds  . Depression    no meds  . Hidradenitis suppurativa   . Hypertension   . Preterm labor   . Trichomonas  Social History   Social History  . Marital status: Divorced    Spouse name: N/A  . Number of children: N/A  . Years of education: N/A   Occupational History  . Not on file.   Social History Main Topics  . Smoking status: Current Every Day Smoker    Packs/day: 0.50    Years: 16.00    Types: Cigarettes  . Smokeless tobacco: Never Used  . Alcohol use Yes     Comment: occasional  .  Drug use: No  . Sexual activity: Yes    Birth control/ protection: Surgical   Other Topics Concern  . Not on file   Social History Narrative  . No narrative on file    Past Surgical History:  Procedure Laterality Date  . ABDOMINAL HYSTERECTOMY    . HYDRADENITIS EXCISION Right 12/07/2013   Procedure: EXCISION HIDRADENITIS AXILLA right axilla abscess ;  Surgeon: Emelia Loron, MD;  Location: [facility];  Service: General;  Laterality: Right;  . HYDRADENITIS EXCISION Bilateral 04/12/2015   Procedure: INCISION AND DRAINAGE BILATERAL HIDRADENITIS AXILLA;  Surgeon: Chevis Pretty III, MD;  Location: [facility];  Service: General;  Laterality: Bilateral;  . INCISION AND DRAINAGE ABSCESS Left 02/23/2015   Procedure: INCISION AND DRAINAGE ABSCESS;  Surgeon: Avel Peace, MD;  Location: [facility];  Service: General;  Laterality: Left;  . MOUTH SURGERY      Family History  Problem Relation Age of Onset  . Cancer Mother   . Cancer Maternal Aunt   . Stroke Paternal Uncle   . Diabetes Paternal Uncle   . Cancer Maternal Grandmother   . Cancer Paternal Grandmother   . Other Neg Hx     Allergies  Allergen Reactions  . Ace Inhibitors Swelling    angioedema  . Flagyl [Metronidazole] Nausea And Vomiting    Current Outpatient Prescriptions on File Prior to Visit  Medication Sig Dispense Refill  . amLODipine (NORVASC) 2.5 MG tablet Take 1 tablet (2.5 mg total) by mouth daily. 30 tablet 1  . clindamycin (CLEOCIN T) 1 % lotion Apply topically 2 (two) times daily. 60 mL 0  . hydrochlorothiazide (MICROZIDE) 12.5 MG capsule Take 1 capsule (12.5 mg total) by mouth daily. 30 capsule 0  . ondansetron (ZOFRAN) 8 MG tablet Take 1 tablet (8 mg total) by mouth every 8 (eight) hours as needed for nausea or vomiting. 20 tablet 0  . oxyCODONE-acetaminophen (ROXICET) 5-325 MG tablet Take 1-2 tablets by mouth every 4 (four) hours as needed. 50 tablet 0  . valACYclovir (VALTREX) 1000 MG tablet Take 1  tablet (1,000 mg total) by mouth as needed. 21 tablet 2   No current facility-administered medications on file prior to visit.     BP 128/90 (BP Location: Left Arm, Patient Position: Sitting, Cuff Size: Normal)   Pulse 66   Temp 98.1 F (36.7 C) (Oral)   Wt 158 lb (71.7 kg)   SpO2 98%   BMI 27.99 kg/m      Objective:   Physical Exam Physical Exam  Constitutional: She is oriented to person, place, and time. She appears well-developed and well-nourished. No distress.  HENT:  Head: Normocephalic and atraumatic.  Right Ear: Tympanic membrane and ear canal normal.  Left Ear: Tympanic membrane and ear canal normal.  Mouth/Throat: Oropharynx is clear and moist.  Eyes: Pupils are equal, round, and reactive to light. No scleral icterus.  Neck: Normal range of motion. No thyromegaly present.  Cardiovascular: Normal rate and regular rhythm.  HR 66 No murmur heard. Pulmonary/Chest: Effort normal and breath sounds normal. No respiratory distress. He has no wheezes. She has no rales. She exhibits no tenderness.  Abdominal: Soft. Bowel sounds are normal. She exhibits no distension and no mass. There is no tenderness. There is no rebound and no guarding.  Musculoskeletal: She exhibits no edema.  Lymphadenopathy:    She has no cervical adenopathy.  Neurological: She is alert and oriented to person, place, and time. She has normal patellar reflexes. She exhibits normal muscle tone. Coordination normal.  Skin: Skin is warm and dry.  Psychiatric: She has a normal mood and affect. Her behavior is normal. Judgment and thought content normal.  Breasts: Examined lying Right: Without masses, retractions, discharge or axillary adenopathy.  Left: Without masses, retractions, discharge or axillary adenopathy.  Inguinal/mons: Normal without inguinal adenopathy  External genitalia: Normal  BUS/Urethra/Skene's glands: Normal  Bladder: Normal  Vagina: Normal  Cervix: Normal; white, thick discharge noted  on exam Adnexa/parametria:  Rt: Without masses or tenderness.  Lt: Without masses or tenderness.  Anus and perineum: Normal      Assessment & Plan:  1. Routine general medical examination at a health care facility 46 y.o. female presenting for annual physical.  Health Maintenance counseling: 1. Anticipatory guidance: Patient counseled regarding regular dental exams, eye exams, wearing seatbelts.  2. Risk factor reduction:  Advised patient of need for regular exercise and diet rich and fruits and vegetables to reduce risk of heart attack and stroke.  3. Immunizations/screenings/ancillary studies:  Declines Influenza vaccine today.  Immunization History  Administered Date(s) Administered  . Td 02/03/2003, 02/03/2007   There are no preventive care reminders to display for this patient.   4. Cervical cancer screening- Completed today; STI screening also added 5. Breast cancer screening-  breast exam today normal, no lesions or masses noted and mammogram referral placed today for screening 6. Colon cancer screening - No needed 7. Skin cancer screening- No suspicious lesions noted on exam on arms, legs, abdomen, and back. - Cytology - PAP  2. Essential hypertension BP:  128/90.  Advised continued monitoring of BP and provided written BP parameters for follow up. She agreed to continue to monitor BP and follow up if >140/90.  We further discussed long term concerns for BP management including CVD and kidney concerns. - CBC with Differential/Platelet - Basic metabolic panel - Lipid panel - Hemoglobin A1c - Hepatic function panel - TSH  3. Sinus arrhythmia Prior EKG completed in 09/2015 without concerning findings and compared to prior EKG by another provider. No irregular rhythm noted today and she declined EKG in office today.  She is asymptomatic.  Advised warning signs to seek medical attention  4. Hidradenitis suppurativa Controlled; She mentioned one area in her groin that is no  longer a concern today; she is followed by [facility] when needed for treatment. She is also planning to be evaluated for long term management of this condition.  5. Screening for breast cancer  - [place] DIGITAL SCREENING BILATERAL; Future  6. Screening examination for sexually transmitted disease  - HIV antibody (with reflex) Additional STI screening will be completed with PAP.  Follow up in one year or sooner if needed.  Yolanda McJulia Lilyth Lawyer, FNP-C

## 2016-04-28 LAB — HIV ANTIBODY (ROUTINE TESTING W REFLEX): HIV: NONREACTIVE

## 2016-04-30 ENCOUNTER — Other Ambulatory Visit: Payer: Self-pay | Admitting: Family Medicine

## 2016-04-30 LAB — CYTOLOGY - PAP
Bacterial vaginitis: POSITIVE — AB
Candida vaginitis: POSITIVE — AB
Chlamydia: NEGATIVE
DIAGNOSIS: NEGATIVE
Neisseria Gonorrhea: NEGATIVE
Trichomonas: NEGATIVE

## 2016-05-01 ENCOUNTER — Encounter: Payer: Self-pay | Admitting: Family Medicine

## 2016-05-06 ENCOUNTER — Other Ambulatory Visit: Payer: Self-pay

## 2016-05-06 ENCOUNTER — Other Ambulatory Visit: Payer: Self-pay | Admitting: Family Medicine

## 2016-05-06 DIAGNOSIS — R7309 Other abnormal glucose: Secondary | ICD-10-CM

## 2016-05-06 MED ORDER — METRONIDAZOLE 0.75 % VA GEL: 1.0000 | Freq: Two times a day (BID) | VAGINAL | 0 refills | Status: AC

## 2016-05-06 NOTE — Progress Notes (Signed)
Metronidazole gel sent to pharmacy to be used twice daily for 5 days.

## 2016-05-06 NOTE — Progress Notes (Signed)
Patient is aware, A referral for Diabetes education has been placed.

## 2016-06-18 DIAGNOSIS — R079 Chest pain, unspecified: Secondary | ICD-10-CM | POA: Insufficient documentation

## 2016-06-18 NOTE — Progress Notes (Signed)
Cardiology Office Note    Date:  06/19/2016   ID:  Renai, Lop[place] 10/30/1970, MRN [id number]  PCP:  Renford Dills, MD  Cardiologist: Lesleigh Noe, MD   Chief Complaint  Patient presents with  . Chest Pain    History of Present Illness:  Yolanda Richard is a 46 y.o. female referred by Dr. Renford Dills for evaluation of atypical chest pain.  The patient is a pleasant female with no prior history of heart disease. She has a history of hypertension that has been intermittently treated, smoke cigarettes, has been recently found to have glucose intolerance/prediabetes. Over the past month she has experienced recurring episodes of vague relatively localized left mid parasternal region. Episodes of discomfort did not radiate and are not associated with dyspnea, palpitations or other complaints. Episodes last up to 30 seconds and then resolved. Episodes seem to be somewhat improved by deep breathing. Occurrence can be at rest or during activity. Activity does not worsen the discomfort.  Her mother has hypertension and congestive heart failure.  Past Medical History:  Diagnosis Date  . Anemia   . Anxiety   . Asthma   . Bipolar disorder (HCC)    no meds  . Depression    no meds  . Hidradenitis suppurativa   . High cholesterol   . Hydradenitis   . Hypertension   . Pre-diabetes   . Preterm labor   . Trichomonas     Past Surgical History:  Procedure Laterality Date  . ABDOMINAL HYSTERECTOMY    . HYDRADENITIS EXCISION Right 12/07/2013   Procedure: EXCISION HIDRADENITIS AXILLA right axilla abscess ;  Surgeon: Emelia Loron, MD;  Location: [facility];  Service: General;  Laterality: Right;  . HYDRADENITIS EXCISION Bilateral 04/12/2015   Procedure: INCISION AND DRAINAGE BILATERAL HIDRADENITIS AXILLA;  Surgeon: Chevis Pretty III, MD;  Location: [facility];  Service: General;  Laterality: Bilateral;  . INCISION AND DRAINAGE ABSCESS Left 02/23/2015   Procedure: INCISION AND DRAINAGE ABSCESS;  Surgeon: Avel Peace, MD;  Location: [facility];  Service: General;  Laterality: Left;  . MOUTH SURGERY      Current Medications: Outpatient Medications Prior to Visit  Medication Sig Dispense Refill  . amLODipine (NORVASC) 2.5 MG tablet Take 1 tablet (2.5 mg total) by mouth daily. 30 tablet 1  . clindamycin (CLEOCIN T) 1 % lotion Apply topically 2 (two) times daily. 60 mL 0  . ondansetron (ZOFRAN) 8 MG tablet Take 1 tablet (8 mg total) by mouth every 8 (eight) hours as needed for nausea or vomiting. 20 tablet 0  . oxyCODONE-acetaminophen (ROXICET) 5-325 MG tablet Take 1-2 tablets by mouth every 4 (four) hours as needed. 50 tablet 0  . valACYclovir (VALTREX) 1000 MG tablet Take 1 tablet (1,000 mg total) by mouth as needed. 21 tablet 2  . hydrochlorothiazide (MICROZIDE) 12.5 MG capsule Take 1 capsule (12.5 mg total) by mouth daily. (Patient not taking: Reported on 06/19/2016) 30 capsule 0   No facility-administered medications prior to visit.      Allergies:   Ace inhibitors and Flagyl [metronidazole]   Social History   Social History  . Marital status: Divorced    Spouse name: N/A  . Number of children: N/A  . Years of education: N/A   Social History Main Topics  . Smoking status: Current Every Day Smoker    Packs/day: 0.50    Years: 16.00    Types: Cigarettes  . Smokeless tobacco: Never Used  . Alcohol use  Yes     Comment: occasional  . Drug use: No  . Sexual activity: Yes    Birth control/ protection: Surgical   Other Topics Concern  . None   Social History Narrative  . None     Family History:  The patient's family history includes Cancer in her maternal aunt, maternal grandmother, mother, and paternal grandmother; Diabetes in her mother and paternal uncle; Healthy in her father; Hypertension in her mother; Stroke in her paternal uncle.   ROS:   Please see the history of present illness.    Snoring, anxiety, leg cramps  including her feet, excessive fatigue, and sweating. Intermittent diarrhea. She has been told that she snores.  All other systems reviewed and are negative.   PHYSICAL EXAM:   VS:  BP 114/82 (BP Location: Right Arm)   Pulse 63   Ht 5' 3.5" (1.613 m)   Wt 162 lb (73.5 kg)   BMI 28.25 kg/m    GEN: Well nourished, well developed, in no acute distress  HEENT: normal  Neck: no JVD, carotid bruits, or masses Cardiac: RRR; no murmurs, rubs, or gallops,no edema  Respiratory:  clear to auscultation bilaterally, normal work of breathing GI: soft, nontender, nondistended, + BS MS: no deformity or atrophy  Skin: warm and dry, no rash Neuro:  Alert and Oriented x 3, Strength and sensation are intact Psych: euthymic mood, full affect  Wt Readings from Last 3 Encounters:  06/19/16 162 lb (73.5 kg)  04/27/16 158 lb (71.7 kg)  09/30/15 155 lb 4 oz (70.4 kg)      Studies/Labs Reviewed:   EKG:  EKG  Normal sinus rhythm with inferolateral T-wave abnormality, unable to exclude the possibility of ischemia. Could be a normal variant.  Recent Labs: 04/27/2016: ALT 11; BUN 9; Creatinine, Ser 0.90; Hemoglobin 14.1; Platelets 296.0; Potassium 4.2; Sodium 140; TSH 0.48   Lipid Panel    Component Value Date/Time   CHOL 176 04/27/2016 1026   TRIG 198.0 (H) 04/27/2016 1026   HDL 64.60 04/27/2016 1026   CHOLHDL 3 04/27/2016 1026   VLDL 39.6 04/27/2016 1026   LDLCALC 72 04/27/2016 1026    Additional studies/ records that were reviewed today include:  Review data provided by Dr. polite from the records at [facility] [place]. Significant discoveries include history of bipolar disorder. Hidradenitis suppurativa, hyperlipidemia, and prediabetes. Mother is diabetic. Mother has hypertension. She has 3 daughters and son who are all in good health. She denies alcohol use.    ASSESSMENT:    1. Chest pain due to myocardial ischemia, unspecified ischemic chest pain type   2. Lipid disorder   3. TOBACCO  USER   4. Prediabetes      PLAN:  In order of problems listed above:  1. Atypical chest discomfort and a 46 year old female with multiple risk factors including diabetes mellitus, hyperlipidemia, hypertension, and tobacco abuse. EKG is abnormal with T-wave abnormality noted above. Stress myocardial perfusion imaging will be performed to rule out ischemia/CAD. If any prolonged chest pain she should report to the emergency department. 2. Low-fat diet. LDL target less than 70. 3. Encouraged to discontinue cigarette smoking. 4. Being managed by Dr. Nehemiah Settle.  Stress myocardial perfusion imaging. Further evaluation if needed. If low risk, I will recommend aggressive risk factor modification. Overall clinical suspicion at this time is the chest discomfort is non-ischemia related.    Medication Adjustments/Labs and Tests Ordered: Current medicines are reviewed at length with the patient today.  Concerns regarding medicines  are outlined above.  Medication changes, Labs and Tests ordered today are listed in the Patient Instructions below. Patient Instructions  Medication Instructions:  None  Labwork: None  Testing/Procedures: Your physician has requested that you have en exercise stress myoview. For further information please visit [email or web address]. Please follow instruction sheet, as given.   Follow-Up: Your physician recommends that you schedule a follow-up appointment as needed with Dr. Katrinka BlazingSmith.    Any Other Special Instructions Will Be Listed Below (If Applicable).     If you need a refill on your cardiac medications before your next appointment, please call your pharmacy.      Signed, Lesleigh NoeHenry W Smith III, MD  06/19/2016 3:19 PM    [facility] [address], [place], [place]  [address] Phone: [phone]) [phone]; Fax: [phone]

## 2016-06-19 ENCOUNTER — Encounter: Payer: Self-pay | Admitting: Interventional Cardiology

## 2016-06-19 ENCOUNTER — Ambulatory Visit (INDEPENDENT_AMBULATORY_CARE_PROVIDER_SITE_OTHER): Payer: 59 | Admitting: Interventional Cardiology

## 2016-06-19 VITALS — BP 114/82 | HR 63 | Ht 63.5 in | Wt 162.0 lb

## 2016-06-19 DIAGNOSIS — R7303 Prediabetes: Secondary | ICD-10-CM | POA: Diagnosis not present

## 2016-06-19 DIAGNOSIS — F17200 Nicotine dependence, unspecified, uncomplicated: Secondary | ICD-10-CM | POA: Diagnosis not present

## 2016-06-19 DIAGNOSIS — I259 Chronic ischemic heart disease, unspecified: Secondary | ICD-10-CM

## 2016-06-19 DIAGNOSIS — E789 Disorder of lipoprotein metabolism, unspecified: Secondary | ICD-10-CM | POA: Diagnosis not present

## 2016-06-19 NOTE — Patient Instructions (Signed)
Medication Instructions:  None  Labwork: None  Testing/Procedures: Your physician has requested that you have en exercise stress myoview. For further information please visit [email or web address]. Please follow instruction sheet, as given.   Follow-Up: Your physician recommends that you schedule a follow-up appointment as needed with Dr. Smith.    Any Other Special Instructions Will Be Listed Below (If Applicable).     If you need a refill on your cardiac medications before your next appointment, please call your pharmacy.   

## 2016-06-22 ENCOUNTER — Encounter (HOSPITAL_COMMUNITY): Payer: Self-pay | Admitting: Emergency Medicine

## 2016-06-22 ENCOUNTER — Inpatient Hospital Stay (HOSPITAL_COMMUNITY)
Admission: EM | Admit: 2016-06-22 | Discharge: 2016-06-24 | DRG: 581 | Disposition: A | Discharge status: Home / Self Care | Payer: 59 | Attending: Surgery | Admitting: Surgery

## 2016-06-22 DIAGNOSIS — L02215 Cutaneous abscess of perineum: Secondary | ICD-10-CM | POA: Diagnosis present

## 2016-06-22 DIAGNOSIS — L02214 Cutaneous abscess of groin: Secondary | ICD-10-CM | POA: Diagnosis present

## 2016-06-22 DIAGNOSIS — J45909 Unspecified asthma, uncomplicated: Secondary | ICD-10-CM | POA: Diagnosis present

## 2016-06-22 DIAGNOSIS — Z881 Allergy status to other antibiotic agents status: Secondary | ICD-10-CM

## 2016-06-22 DIAGNOSIS — L0231 Cutaneous abscess of buttock: Secondary | ICD-10-CM | POA: Diagnosis present

## 2016-06-22 DIAGNOSIS — Z888 Allergy status to other drugs, medicaments and biological substances status: Secondary | ICD-10-CM

## 2016-06-22 DIAGNOSIS — L732 Hidradenitis suppurativa: Principal | ICD-10-CM

## 2016-06-22 DIAGNOSIS — Z79899 Other long term (current) drug therapy: Secondary | ICD-10-CM

## 2016-06-22 DIAGNOSIS — F17210 Nicotine dependence, cigarettes, uncomplicated: Secondary | ICD-10-CM | POA: Diagnosis present

## 2016-06-22 DIAGNOSIS — I10 Essential (primary) hypertension: Secondary | ICD-10-CM | POA: Diagnosis present

## 2016-06-22 DIAGNOSIS — Z7982 Long term (current) use of aspirin: Secondary | ICD-10-CM

## 2016-06-22 NOTE — ED Triage Notes (Signed)
Patient reports cluster of abscesses to perineum for past month. States her PCP could not handle in their office. Pt was started on Clindamycin on Friday as well as hydrochlorothiazide.

## 2016-06-22 NOTE — ED Notes (Signed)
Called for triage x1 with no response. 

## 2016-06-22 NOTE — ED Notes (Signed)
Pt states that she was [facility] and was sent here, pt thought the doctor called ahead but I can't find any record of that phone call Pt also states that she's not able to have these lanced

## 2016-06-23 ENCOUNTER — Inpatient Hospital Stay (HOSPITAL_COMMUNITY): Payer: 59 | Admitting: Anesthesiology

## 2016-06-23 ENCOUNTER — Encounter (HOSPITAL_COMMUNITY): Admission: EM | Disposition: A | Discharge status: Home / Self Care | Payer: Self-pay | Source: Home / Self Care

## 2016-06-23 ENCOUNTER — Encounter (HOSPITAL_COMMUNITY): Payer: Self-pay | Admitting: Certified Registered"

## 2016-06-23 ENCOUNTER — Telehealth (HOSPITAL_COMMUNITY): Payer: Self-pay | Admitting: *Deleted

## 2016-06-23 DIAGNOSIS — Z7982 Long term (current) use of aspirin: Secondary | ICD-10-CM | POA: Diagnosis not present

## 2016-06-23 DIAGNOSIS — L732 Hidradenitis suppurativa: Secondary | ICD-10-CM | POA: Diagnosis present

## 2016-06-23 DIAGNOSIS — F17210 Nicotine dependence, cigarettes, uncomplicated: Secondary | ICD-10-CM | POA: Diagnosis present

## 2016-06-23 DIAGNOSIS — J45909 Unspecified asthma, uncomplicated: Secondary | ICD-10-CM | POA: Diagnosis present

## 2016-06-23 DIAGNOSIS — Z79899 Other long term (current) drug therapy: Secondary | ICD-10-CM | POA: Diagnosis not present

## 2016-06-23 DIAGNOSIS — L0231 Cutaneous abscess of buttock: Secondary | ICD-10-CM | POA: Diagnosis present

## 2016-06-23 DIAGNOSIS — Z888 Allergy status to other drugs, medicaments and biological substances status: Secondary | ICD-10-CM | POA: Diagnosis not present

## 2016-06-23 DIAGNOSIS — Z881 Allergy status to other antibiotic agents status: Secondary | ICD-10-CM | POA: Diagnosis not present

## 2016-06-23 DIAGNOSIS — L02215 Cutaneous abscess of perineum: Secondary | ICD-10-CM | POA: Diagnosis present

## 2016-06-23 DIAGNOSIS — L02214 Cutaneous abscess of groin: Secondary | ICD-10-CM | POA: Diagnosis present

## 2016-06-23 DIAGNOSIS — I10 Essential (primary) hypertension: Secondary | ICD-10-CM | POA: Diagnosis present

## 2016-06-23 HISTORY — PX: IRRIGATION AND DEBRIDEMENT ABSCESS: SHX5252

## 2016-06-23 LAB — CBC WITH DIFFERENTIAL/PLATELET
Basophils Absolute: 0.1 K/uL (ref 0.0–0.1)
Basophils Relative: 1 %
Eosinophils Absolute: 0.2 K/uL (ref 0.0–0.7)
Eosinophils Relative: 2 %
HCT: 40.3 % (ref 36.0–46.0)
Hemoglobin: 13.8 g/dL (ref 12.0–15.0)
Lymphocytes Relative: 40 %
Lymphs Abs: 5.2 K/uL — ABNORMAL HIGH (ref 0.7–4.0)
MCH: 29.8 pg (ref 26.0–34.0)
MCHC: 34.2 g/dL (ref 30.0–36.0)
MCV: 87 fL (ref 78.0–100.0)
Monocytes Absolute: 0.6 K/uL (ref 0.1–1.0)
Monocytes Relative: 5 %
Neutro Abs: 7 K/uL (ref 1.7–7.7)
Neutrophils Relative %: 52 %
Platelets: 287 K/uL (ref 150–400)
RBC: 4.63 MIL/uL (ref 3.87–5.11)
RDW: 14.4 % (ref 11.5–15.5)
WBC: 13.2 K/uL — ABNORMAL HIGH (ref 4.0–10.5)

## 2016-06-23 LAB — BASIC METABOLIC PANEL WITH GFR
Anion gap: 7 (ref 5–15)
BUN: 14 mg/dL (ref 6–20)
CO2: 26 mmol/L (ref 22–32)
Calcium: 9.3 mg/dL (ref 8.9–10.3)
Chloride: 105 mmol/L (ref 101–111)
Creatinine, Ser: 0.85 mg/dL (ref 0.44–1.00)
GFR calc Af Amer: >60 mL/min (ref >60)
GFR calc non Af Amer: >60 mL/min (ref >60)
Glucose, Bld: 107 mg/dL — ABNORMAL HIGH (ref 65–99)
Potassium: 3.8 mmol/L (ref 3.5–5.1)
Sodium: 138 mmol/L (ref 135–145)

## 2016-06-23 LAB — I-STAT BETA HCG BLOOD, ED (MC, WL, AP ONLY): I-stat hCG, quantitative: <5 m[IU]/mL (ref <5)

## 2016-06-23 LAB — I-STAT CG4 LACTIC ACID, ED: Lactic Acid, Venous: 0.5 mmol/L (ref 0.5–1.9)

## 2016-06-23 SURGERY — IRRIGATION AND DEBRIDEMENT ABSCESS
Anesthesia: General | Site: Groin | Laterality: Bilateral

## 2016-06-23 MED ORDER — FENTANYL CITRATE (PF) 100 MCG/2ML IJ SOLN
INTRAMUSCULAR | Status: DC | PRN
  Administered 2016-06-23 (×2): 50 ug via INTRAVENOUS

## 2016-06-23 MED ORDER — HYDRALAZINE HCL 20 MG/ML IJ SOLN: 10.0000 mg | INTRAMUSCULAR | Status: DC | PRN

## 2016-06-23 MED ORDER — ENOXAPARIN SODIUM 40 MG/0.4ML ~~LOC~~ SOLN: 40.0000 mg | SUBCUTANEOUS | Status: DC

## 2016-06-23 MED ORDER — DOCUSATE SODIUM 100 MG PO CAPS
100.0000 mg | ORAL_CAPSULE | Freq: Two times a day (BID) | ORAL | Status: DC
  Administered 2016-06-23 – 2016-06-24 (×2): 100 mg via ORAL
  Filled 2016-06-23 (×2): qty 1

## 2016-06-23 MED ORDER — 0.9 % SODIUM CHLORIDE (POUR BTL) OPTIME
TOPICAL | Status: DC | PRN
  Administered 2016-06-23: 1000 mL

## 2016-06-23 MED ORDER — DIPHENHYDRAMINE HCL 25 MG PO CAPS
25.0000 mg | ORAL_CAPSULE | Freq: Once | ORAL | Status: AC
  Administered 2016-06-23: 25 mg via ORAL
  Filled 2016-06-23: qty 1

## 2016-06-23 MED ORDER — HYDRALAZINE HCL 20 MG/ML IJ SOLN
5.0000 mg | Freq: Once | INTRAMUSCULAR | Status: AC
  Administered 2016-06-23: 5 mg via INTRAVENOUS

## 2016-06-23 MED ORDER — MEPERIDINE HCL 50 MG/ML IJ SOLN
INTRAMUSCULAR | Status: AC
  Administered 2016-06-23: 12.5 mg via INTRAVENOUS
  Filled 2016-06-23: qty 1

## 2016-06-23 MED ORDER — ONDANSETRON HCL 4 MG/2ML IJ SOLN
INTRAMUSCULAR | Status: DC | PRN
  Administered 2016-06-23: 4 mg via INTRAVENOUS

## 2016-06-23 MED ORDER — MIDAZOLAM HCL 2 MG/2ML IJ SOLN
INTRAMUSCULAR | Status: AC
  Filled 2016-06-23: qty 2

## 2016-06-23 MED ORDER — DEXTROSE-NACL 5-0.45 % IV SOLN
INTRAVENOUS | Status: DC
  Administered 2016-06-23: 22:00:00 via INTRAVENOUS

## 2016-06-23 MED ORDER — ONDANSETRON HCL 4 MG/2ML IJ SOLN
INTRAMUSCULAR | Status: AC
  Filled 2016-06-23: qty 2

## 2016-06-23 MED ORDER — LIDOCAINE 2% (20 MG/ML) 5 ML SYRINGE
INTRAMUSCULAR | Status: AC
  Filled 2016-06-23: qty 5

## 2016-06-23 MED ORDER — OXYCODONE HCL 5 MG PO TABS
5.0000 mg | ORAL_TABLET | ORAL | Status: DC | PRN
  Administered 2016-06-23: 5 mg via ORAL
  Filled 2016-06-23: qty 1

## 2016-06-23 MED ORDER — PROPOFOL 10 MG/ML IV BOLUS
INTRAVENOUS | Status: AC
Start: 2016-06-23 — End: 2016-06-23
  Filled 2016-06-23: qty 20

## 2016-06-23 MED ORDER — HYDROMORPHONE HCL 1 MG/ML IJ SOLN
1.0000 mg | INTRAMUSCULAR | Status: DC | PRN
  Administered 2016-06-23 – 2016-06-24 (×6): 1 mg via INTRAVENOUS
  Filled 2016-06-23 (×6): qty 1

## 2016-06-23 MED ORDER — ONDANSETRON HCL 4 MG/2ML IJ SOLN: 4.0000 mg | Freq: Four times a day (QID) | INTRAMUSCULAR | Status: DC | PRN

## 2016-06-23 MED ORDER — CIPROFLOXACIN IN D5W 400 MG/200ML IV SOLN
400.0000 mg | Freq: Two times a day (BID) | INTRAVENOUS | Status: DC
Start: 2016-06-23 — End: 2016-06-23
  Administered 2016-06-23 (×2): 400 mg via INTRAVENOUS
  Filled 2016-06-23: qty 200

## 2016-06-23 MED ORDER — PROPOFOL 10 MG/ML IV BOLUS
INTRAVENOUS | Status: DC | PRN
  Administered 2016-06-23: 160 mg via INTRAVENOUS

## 2016-06-23 MED ORDER — FENTANYL CITRATE (PF) 100 MCG/2ML IJ SOLN
INTRAMUSCULAR | Status: AC
  Filled 2016-06-23: qty 2

## 2016-06-23 MED ORDER — CIPROFLOXACIN IN D5W 400 MG/200ML IV SOLN
INTRAVENOUS | Status: AC
  Filled 2016-06-23: qty 200

## 2016-06-23 MED ORDER — LACTATED RINGERS IV SOLN: INTRAVENOUS | Status: DC

## 2016-06-23 MED ORDER — PROMETHAZINE HCL 25 MG/ML IJ SOLN: 6.2500 mg | INTRAMUSCULAR | Status: DC | PRN

## 2016-06-23 MED ORDER — MIDAZOLAM HCL 5 MG/5ML IJ SOLN
INTRAMUSCULAR | Status: DC | PRN
  Administered 2016-06-23: 2 mg via INTRAVENOUS

## 2016-06-23 MED ORDER — LACTATED RINGERS IV SOLN
INTRAVENOUS | Status: DC
  Administered 2016-06-23: 16:00:00 via INTRAVENOUS

## 2016-06-23 MED ORDER — HYDROMORPHONE HCL 1 MG/ML IJ SOLN
INTRAMUSCULAR | Status: AC
  Administered 2016-06-23: 0.5 mg via INTRAVENOUS
  Filled 2016-06-23: qty 1

## 2016-06-23 MED ORDER — MEPERIDINE HCL 50 MG/ML IJ SOLN
6.2500 mg | INTRAMUSCULAR | Status: DC | PRN
  Administered 2016-06-23: 12.5 mg via INTRAVENOUS

## 2016-06-23 MED ORDER — ACETAMINOPHEN 325 MG PO TABS: 650.0000 mg | ORAL_TABLET | Freq: Four times a day (QID) | ORAL | Status: DC | PRN

## 2016-06-23 MED ORDER — HYDRALAZINE HCL 20 MG/ML IJ SOLN
INTRAMUSCULAR | Status: AC
  Administered 2016-06-23: 5 mg via INTRAVENOUS
  Filled 2016-06-23: qty 1

## 2016-06-23 MED ORDER — ONDANSETRON 4 MG PO TBDP
4.0000 mg | ORAL_TABLET | Freq: Four times a day (QID) | ORAL | Status: DC | PRN
Start: 2016-06-23 — End: 2016-06-24

## 2016-06-23 MED ORDER — DOXYCYCLINE HYCLATE 100 MG PO TABS
100.0000 mg | ORAL_TABLET | Freq: Two times a day (BID) | ORAL | Status: DC
  Administered 2016-06-23 – 2016-06-24 (×2): 100 mg via ORAL
  Filled 2016-06-23 (×2): qty 1

## 2016-06-23 MED ORDER — HYDROMORPHONE HCL 1 MG/ML IJ SOLN
0.2500 mg | INTRAMUSCULAR | Status: DC | PRN
  Administered 2016-06-23 (×2): 0.5 mg via INTRAVENOUS

## 2016-06-23 MED ORDER — LIDOCAINE 2% (20 MG/ML) 5 ML SYRINGE
INTRAMUSCULAR | Status: DC | PRN
  Administered 2016-06-23: 100 mg via INTRAVENOUS

## 2016-06-23 SURGICAL SUPPLY — 28 items
ADH SKN CLS APL DERMABOND .7 (GAUZE/BANDAGES/DRESSINGS)
BLADE SURG SZ11 CARB STEEL (BLADE) ×3 IMPLANT
CHLORAPREP W/TINT 26ML (MISCELLANEOUS) ×3 IMPLANT
COVER SURGICAL LIGHT HANDLE (MISCELLANEOUS) ×3 IMPLANT
DECANTER SPIKE VIAL GLASS SM (MISCELLANEOUS) IMPLANT
DERMABOND ADVANCED (GAUZE/BANDAGES/DRESSINGS)
DERMABOND ADVANCED .7 DNX12 (GAUZE/BANDAGES/DRESSINGS) ×1 IMPLANT
DRAPE LAPAROSCOPIC ABDOMINAL (DRAPES) IMPLANT
DRAPE LAPAROTOMY TRNSV 102X78 (DRAPE) IMPLANT
DRSG PAD ABDOMINAL 8X10 ST (GAUZE/BANDAGES/DRESSINGS) IMPLANT
ELECT REM PT RETURN 15FT ADLT (MISCELLANEOUS) ×3 IMPLANT
GAUZE PACKING IODOFORM 1/4X15 (GAUZE/BANDAGES/DRESSINGS) ×2 IMPLANT
GAUZE SPONGE 4X4 12PLY STRL (GAUZE/BANDAGES/DRESSINGS) ×2 IMPLANT
GLOVE BIO SURGEON STRL SZ 6 (GLOVE) ×3 IMPLANT
GLOVE INDICATOR 6.5 STRL GRN (GLOVE) ×3 IMPLANT
GLOVE SURG SS PI 7.0 STRL IVOR (GLOVE) ×3 IMPLANT
GOWN STRL REUS W/TWL LRG LVL3 (GOWN DISPOSABLE) ×3 IMPLANT
GOWN STRL REUS W/TWL XL LVL3 (GOWN DISPOSABLE) ×3 IMPLANT
KIT BASIN OR (CUSTOM PROCEDURE TRAY) ×3 IMPLANT
LUBRICANT JELLY K Y 4OZ (MISCELLANEOUS) IMPLANT
PACK GENERAL/GYN (CUSTOM PROCEDURE TRAY) ×3 IMPLANT
PAD ABD 8X10 STRL (GAUZE/BANDAGES/DRESSINGS) ×2 IMPLANT
SUT MNCRL AB 4-0 PS2 18 (SUTURE) IMPLANT
SUT VICRYL 3 0 (SUTURE) IMPLANT
SWAB COLLECTION DEVICE MRSA (MISCELLANEOUS) ×2 IMPLANT
SWAB CULTURE ESWAB REG 1ML (MISCELLANEOUS) ×2 IMPLANT
TOWEL OR 17X26 10 PK STRL BLUE (TOWEL DISPOSABLE) ×3 IMPLANT
TOWEL OR NON WOVEN STRL DISP B (DISPOSABLE) ×3 IMPLANT

## 2016-06-23 NOTE — ED Notes (Signed)
Gave report to [place], Charity fundraiserN.

## 2016-06-23 NOTE — Telephone Encounter (Signed)
Left message on voicemail per DPR in reference to upcoming appointment scheduled on 06/26/16 at 1130 with detailed instructions given per Myocardial Perfusion Study Information Sheet for the test. LM to arrive 15 minutes early, and that it is imperative to arrive on time for appointment to keep from having the test rescheduled. If you need to cancel or reschedule your appointment, please call the office within 24 hours of your appointment. Failure to do so may result in a cancellation of your appointment, and a $50 no show fee. Phone number given for call back for any questions.

## 2016-06-23 NOTE — Anesthesia Procedure Notes (Signed)
Procedure Name: LMA Insertion Date/Time: 06/23/2016 4:43 PM Performed by: Enriqueta ShutterWILLIFORD, Shonya Sumida D Pre-anesthesia Checklist: Patient identified, Emergency Drugs available, Suction available and Patient being monitored Patient Re-evaluated:Patient Re-evaluated prior to inductionOxygen Delivery Method: Circle system utilized Preoxygenation: Pre-oxygenation with 100% oxygen Intubation Type: IV induction Ventilation: Mask ventilation without difficulty LMA: LMA inserted LMA Size: 4.0 Tube type: Oral Number of attempts: 1 Placement Confirmation: positive ETCO2 and breath sounds checked- equal and bilateral Tube secured with: Tape Dental Injury: Teeth and Oropharynx as per pre-operative assessment

## 2016-06-23 NOTE — Progress Notes (Signed)
House coverage contacted(Tammy) by RN.  AC aware of pts, frustration for being in hospital 20 hours, on a stretcher, and waiting for a room.  Pt extremely frustrated, pt informed of current bed situation, that bed is to be STAT cleaned.

## 2016-06-23 NOTE — Interval H&P Note (Signed)
History and Physical Interval Note:  06/23/2016 10:27 AM  Yolanda PetersShaunita Clinton SawyerWilliamson  has presented today for surgery, with the diagnosis of RIGHT GROIN ABCESS  The various methods of treatment have been discussed with the patient and family. After consideration of risks, benefits and other options for treatment, the patient has consented to  Procedure(s): IRRIGATION AND DEBRIDEMENT RIGHT GROIN ABCESS (Right) as a surgical intervention .  The patient's history has been reviewed, patient examined, no change in status, stable for surgery.  I have reviewed the patient's chart and labs.  Questions were answered to the patient's satisfaction.     Coulson Wehner Lollie SailsA Phebe Dettmer

## 2016-06-23 NOTE — Anesthesia Preprocedure Evaluation (Addendum)
Anesthesia Evaluation  Patient identified by MRN, date of birth, ID band Patient awake    Reviewed: Allergy & Precautions, NPO status , Patient's Chart, lab work & pertinent test results  Airway Mallampati: I       Dental  (+) Teeth Intact, Dental Advisory Given   Pulmonary asthma , Current Smoker,    breath sounds clear to auscultation       Cardiovascular hypertension, Pt. on medications  Rhythm:Regular Rate:Normal     Neuro/Psych  Headaches, PSYCHIATRIC DISORDERS Anxiety Depression Bipolar Disorder    GI/Hepatic negative GI ROS, Neg liver ROS,   Endo/Other  negative endocrine ROS  Renal/GU negative Renal ROS  negative genitourinary   Musculoskeletal negative musculoskeletal ROS (+)   Abdominal   Peds negative pediatric ROS (+)  Hematology negative hematology ROS (+)   Anesthesia Other Findings   Reproductive/Obstetrics negative OB ROS                            Lab Results  Component Value Date   WBC 13.2 (H) 06/23/2016   HGB 13.8 06/23/2016   HCT 40.3 06/23/2016   MCV 87.0 06/23/2016   PLT 287 06/23/2016   EKG: normal sinus rhythm.   Anesthesia Physical Anesthesia Plan  ASA: II  Anesthesia Plan: General   Post-op Pain Management:    Induction: Intravenous  Airway Management Planned: LMA  Additional Equipment:   Intra-op Plan:   Post-operative Plan: Extubation in OR  Informed Consent: I have reviewed the patients History and Physical, chart, labs and discussed the procedure including the risks, benefits and alternatives for the proposed anesthesia with the patient or authorized representative who has indicated his/her understanding and acceptance.   Dental advisory given  Plan Discussed with: CRNA  Anesthesia Plan Comments:         Anesthesia Quick Evaluation

## 2016-06-23 NOTE — ED Notes (Signed)
Bed: WA19 Expected date:  Expected time:  Means of arrival:  Comments: 

## 2016-06-23 NOTE — Transfer of Care (Signed)
Immediate Anesthesia Transfer of Care Note  Patient: Yolanda Richard  Procedure(s) Performed: Procedure(s): IRRIGATION AND DEBRIDEMENT BILATERAL GROIN ABCESSES (Bilateral)  Patient Location: PACU  Anesthesia Type:General  Level of Consciousness: awake, alert  and oriented  Airway & Oxygen Therapy: Patient Spontanous Breathing and Patient connected to face mask oxygen  Post-op Assessment: Report given to RN and Post -op Vital signs reviewed and stable  Post vital signs: Reviewed and stable  Last Vitals:  Vitals:   06/23/16 1246 06/23/16 1400  BP: (!) 161/102 139/83  Pulse: 60 (!) 43  Resp: 17   Temp:      Last Pain:  Vitals:   06/23/16 0128  TempSrc: Oral  PainSc:          Complications: No apparent anesthesia complications

## 2016-06-23 NOTE — ED Provider Notes (Signed)
[facility] Provider Note   CSN: [id number] Arrival date & time: 06/22/16  1950  By signing my name below, I, Ny'Kea Lewis, attest that this documentation has been prepared under the direction and in the presence of Terance Hart, PA-C. Electronically Signed: Karren Cobble, ED Scribe. 06/23/16. 1:21 AM.   History   Chief Complaint Chief Complaint  Patient presents with  . Abscess   The history is provided by the patient. No language interpreter was used.    HPI Comments: Adriyanna Kage is a 46 y.o. female with history of hidradenitis who presents to the Emergency Department complaining of a moderate, gradually worsening abscess and swelling to the groin onset a few weeks ago. Pt associated sx nausea, fatigue, and appetite change. She notes that it started back in March and resolved without treatment, but returned a few weeks ago and have been persistent since. Pt  was seen by [facility] 4 days ago and was started on Clindamycin with no improvement. She went back to her PCP today and they attempted to lance it which caused worsening of symptoms. Pt states pain is exacerbated with palpation and direct pressure. Denies fever or any other acute associated symptoms at this time.  Pt had surgery on her axilla before - last time was in March.    Past Medical History:  Diagnosis Date  . Anemia   . Anxiety   . Asthma   . Bipolar disorder (HCC)    no meds  . Depression    no meds  . Hidradenitis suppurativa   . High cholesterol   . Hydradenitis   . Hypertension   . Pre-diabetes   . Preterm labor   . Trichomonas     Patient Active Problem List   Diagnosis Date Noted  . Chest pain 06/18/2016  . Sinus arrhythmia 09/30/2015  . Generalized anxiety disorder 06/21/2014  . Axillary abscess, left s/p complex incision and drainage 02/23/15 12/06/2013  . Hidradenitis suppurativa   . Lipid disorder 10/23/2008  . TOBACCO USER 10/23/2008  . MIGRAINE HEADACHE  10/23/2008  . HYPERTENSION 10/23/2008  . HEADACHE 10/23/2008  . GENITAL HERPES 10/22/2008  . ANEMIA-NOS 10/22/2008  . ASTHMA 10/22/2008    Past Surgical History:  Procedure Laterality Date  . ABDOMINAL HYSTERECTOMY    . HYDRADENITIS EXCISION Right 12/07/2013   Procedure: EXCISION HIDRADENITIS AXILLA right axilla abscess ;  Surgeon: Emelia Loron, MD;  Location: [facility];  Service: General;  Laterality: Right;  . HYDRADENITIS EXCISION Bilateral 04/12/2015   Procedure: INCISION AND DRAINAGE BILATERAL HIDRADENITIS AXILLA;  Surgeon: Chevis Pretty III, MD;  Location: [facility];  Service: General;  Laterality: Bilateral;  . INCISION AND DRAINAGE ABSCESS Left 02/23/2015   Procedure: INCISION AND DRAINAGE ABSCESS;  Surgeon: Avel Peace, MD;  Location: [facility];  Service: General;  Laterality: Left;  . MOUTH SURGERY      OB History    Gravida Para Term Preterm AB Living   5 4 3 1 1 4    SAB TAB Ectopic Multiple Live Births     1             Home Medications    Prior to Admission medications   Medication Sig Start Date End Date Taking? Authorizing Provider  aspirin EC 81 MG tablet Take 81 mg by mouth daily.   Yes [provider]  clindamycin (CLEOCIN T) 1 % lotion Apply topically 2 (two) times daily. 03/18/15  Yes Kordsmeier, Amil Amen, FNP  hydrochlorothiazide (MICROZIDE) 12.5  MG capsule Take 1 capsule (12.5 mg total) by mouth daily. 03/14/15  Yes Kordsmeier, Amil AmenJulia, FNP  valACYclovir (VALTREX) 1000 MG tablet Take 1 tablet (1,000 mg total) by mouth as needed. Patient taking differently: Take 1,000 mg by mouth daily as needed (outbreak).  04/27/16  Yes Kordsmeier, Amil AmenJulia, FNP  amLODipine (NORVASC) 2.5 MG tablet Take 1 tablet (2.5 mg total) by mouth daily. Patient not taking: Reported on 06/23/2016 03/28/15   Roddie McKordsmeier, Julia, FNP    Family History Family History  Problem Relation Age of Onset  . Cancer Mother   . Diabetes Mother   . Hypertension Mother   . Cancer  Maternal Aunt   . Stroke Paternal Uncle   . Diabetes Paternal Uncle   . Cancer Maternal Grandmother   . Cancer Paternal Grandmother   . Healthy Father   . Other Neg Hx     Social History Social History  Substance Use Topics  . Smoking status: Current Every Day Smoker    Packs/day: 0.50    Years: 16.00    Types: Cigarettes  . Smokeless tobacco: Never Used  . Alcohol use Yes     Comment: occasional     Allergies   Ace inhibitors and Flagyl [metronidazole]   Review of Systems Review of Systems  Constitutional: Positive for appetite change and fatigue. Negative for fever.  Gastrointestinal: Positive for nausea.  Genitourinary:       +abscesses  All other systems reviewed and are negative.    Physical Exam Updated Vital Signs BP (!) 153/97 (BP Location: Left Arm) Comment: pt states "my blood pressure is always high"  Pulse (!) 55   Temp 98.4 F (36.9 C) (Oral)   Resp 16   SpO2 100%   Physical Exam  Constitutional: She is oriented to person, place, and time. She appears well-developed and well-nourished. No distress.  HENT:  Head: Normocephalic and atraumatic.  Eyes: Conjunctivae are normal. Pupils are equal, round, and reactive to light. Right eye exhibits no discharge. Left eye exhibits no discharge. No scleral icterus.  Neck: Normal range of motion.  Cardiovascular: Normal rate.   Pulmonary/Chest: Effort normal. No respiratory distress.  Abdominal: She exhibits no distension.  Genitourinary:  Genitourinary Comments: 8x2 cm abscess with tracking in the right groin. Tender to palpation. No drainage.  3x2 cm abscess which is tender to palpation. No drainage.  Neurological: She is alert and oriented to person, place, and time.  Skin: Skin is warm and dry.  Psychiatric: She has a normal mood and affect. Her behavior is normal.  Nursing note and vitals reviewed.    ED Treatments / Results  DIAGNOSTIC STUDIES: Oxygen Saturation is 100% on RA, normal by my  interpretation.   COORDINATION OF CARE: 1:05 AM-Discussed next steps with pt. Pt verbalized understanding and is agreeable with the plan. ' Labs (all labs ordered are listed, but only abnormal results are displayed) Labs Reviewed  BASIC METABOLIC PANEL - Abnormal; Notable for the following:       Result Value   Glucose, Bld 107 (*)    All other components within normal limits  CBC WITH DIFFERENTIAL/PLATELET - Abnormal; Notable for the following:    WBC 13.2 (*)    Lymphs Abs 5.2 (*)    All other components within normal limits  I-STAT CG4 LACTIC ACID, ED  I-STAT BETA HCG BLOOD, ED ([facility], [facility], [facility] ONLY)    EKG  EKG Interpretation None       Radiology No results found.  Procedures  Procedures (including critical care time)  Medications Ordered in ED Medications - No data to display   Initial Impression / Assessment and Plan / ED Course  I have reviewed the triage vital signs and the nursing notes.  Pertinent labs & imaging results that were available during my care of the patient were reviewed by me and considered in my medical decision making (see chart for details).  46 year old female with hidradenitis suppurativa flare. She is hypertensive otherwise vitals are normal. CBC remarkable for leukocytosis of 13.2. BMP unremarkable. Lactic acid is normal. Patient is demanding to be admitted for surgery and is upset about the long wait time. She is also upset that I don't know that she gets admitted "every time". Labs were obtained and I advised that I would talk to general surgery.   CBC remarkable for leukocytosis of 13.2. BMP unremarkable. Lactic acid normal. Spoke with Dr. Luisa Hart who will come to see patient in the morning.   Final Clinical Impressions(s) / ED Diagnoses   Final diagnoses:  Hidradenitis suppurativa    New Prescriptions New Prescriptions   No medications on file   I personally performed the services described in this documentation, which was scribed  in my presence. The recorded information has been reviewed and is accurate.      Bethel Born, PA-C 06/23/16 Cherylann Parr, April, MD 06/23/16 [phone]

## 2016-06-23 NOTE — H&P (Signed)
Yolanda Richard is an 46 y.o. female.   Chief Complaint: hidradenitis right groin HPI: patient presents to the emergency room from [facility] urgent care secondary to right groin abscess. She has a history of hidradenitis status post d   Drainage of right groin abscess in March 2018. She presents with a four-day history of increasing swelling, pain and drainage from the right groin. She was seen in urgent care yesterday where an attempted incision and drainage was done. There were unsuccessful and sent her to the emergency room. She has a long-standing history of hidradenitis of both groins and left axilla. She complains of right groin drainage, swelling and pain currently.  Past Medical History:  Diagnosis Date  . Anemia   . Anxiety   . Asthma   . Bipolar disorder ([place])    no meds  . Depression    no meds  . Hidradenitis suppurativa   . High cholesterol   . Hydradenitis   . Hypertension   . Pre-diabetes   . Preterm labor   . Trichomonas     Past Surgical History:  Procedure Laterality Date  . ABDOMINAL HYSTERECTOMY    . HYDRADENITIS EXCISION Right 12/07/2013   Procedure: EXCISION HIDRADENITIS AXILLA right axilla abscess ;  Surgeon: Rolm Bookbinder, MD;  Location: [facility];  Service: General;  Laterality: Right;  . HYDRADENITIS EXCISION Bilateral 04/12/2015   Procedure: INCISION AND DRAINAGE BILATERAL HIDRADENITIS AXILLA;  Surgeon: Autumn Messing III, MD;  Location: [facility];  Service: General;  Laterality: Bilateral;  . INCISION AND DRAINAGE ABSCESS Left 02/23/2015   Procedure: INCISION AND DRAINAGE ABSCESS;  Surgeon: Jackolyn Confer, MD;  Location: [facility];  Service: General;  Laterality: Left;  . MOUTH SURGERY      Family History  Problem Relation Age of Onset  . Cancer Mother   . Diabetes Mother   . Hypertension Mother   . Cancer Maternal Aunt   . Stroke Paternal Uncle   . Diabetes Paternal Uncle   . Cancer Maternal Grandmother   . Cancer Paternal Grandmother     . Healthy Father   . Other Neg Hx    Social History:  reports that she has been smoking Cigarettes.  She has a 8.00 pack-year smoking history. She has never used smokeless tobacco. She reports that she drinks alcohol. She reports that she does not use drugs.  Allergies:  Allergies  Allergen Reactions  . Ace Inhibitors Swelling    angioedema  . Flagyl [Metronidazole] Nausea And Vomiting     (Not in a hospital admission)  Results for orders placed or performed during the hospital encounter of 06/22/16 (from the past 48 hour(s))  Basic metabolic panel     Status: Abnormal   Collection Time: 06/23/16  1:25 AM  Result Value Ref Range   Sodium 138 135 - 145 mmol/L   Potassium 3.8 3.5 - 5.1 mmol/L   Chloride 105 101 - 111 mmol/L   CO2 26 22 - 32 mmol/L   Glucose, Bld 107 (H) 65 - 99 mg/dL   BUN 14 6 - 20 mg/dL   Creatinine, Ser 0.85 0.44 - 1.00 mg/dL   Calcium 9.3 8.9 - 10.3 mg/dL   GFR calc non Af Amer >60 >60 mL/min   GFR calc Af Amer >60 >60 mL/min    Comment: (NOTE) The eGFR has been calculated using the CKD EPI equation. This calculation has not been validated in all clinical situations. eGFR's persistently <60 mL/min signify possible Chronic Kidney Disease.  Anion gap 7 5 - 15  CBC with Differential     Status: Abnormal   Collection Time: 06/23/16  1:25 AM  Result Value Ref Range   WBC 13.2 (H) 4.0 - 10.5 K/uL   RBC 4.63 3.87 - 5.11 MIL/uL   Hemoglobin 13.8 12.0 - 15.0 g/dL   HCT 40.3 36.0 - 46.0 %   MCV 87.0 78.0 - 100.0 fL   MCH 29.8 26.0 - 34.0 pg   MCHC 34.2 30.0 - 36.0 g/dL   RDW 14.4 11.5 - 15.5 %   Platelets 287 150 - 400 K/uL   Neutrophils Relative % 52 %   Neutro Abs 7.0 1.7 - 7.7 K/uL   Lymphocytes Relative 40 %   Lymphs Abs 5.2 (H) 0.7 - 4.0 K/uL   Monocytes Relative 5 %   Monocytes Absolute 0.6 0.1 - 1.0 K/uL   Eosinophils Relative 2 %   Eosinophils Absolute 0.2 0.0 - 0.7 K/uL   Basophils Relative 1 %   Basophils Absolute 0.1 0.0 - 0.1 K/uL   I-Stat beta hCG blood, ED     Status: None   Collection Time: 06/23/16  1:32 AM  Result Value Ref Range   I-stat hCG, quantitative <5.0 <5 mIU/mL   Comment 3            Comment:   GEST. AGE      CONC.  (mIU/mL)   <=1 WEEK        5 - 50     2 WEEKS       50 - 500     3 WEEKS       100 - 10,000     4 WEEKS     1,000 - 30,000        FEMALE AND NON-PREGNANT FEMALE:     LESS THAN 5 mIU/mL   I-Stat CG4 Lactic Acid, ED     Status: None   Collection Time: 06/23/16  1:33 AM  Result Value Ref Range   Lactic Acid, Venous 0.50 0.5 - 1.9 mmol/L   No results found.  Review of Systems  Constitutional: Positive for chills and fever. Negative for malaise/fatigue.  HENT: Negative.   Eyes: Negative.   Respiratory: Negative.   Cardiovascular: Negative.   Musculoskeletal: Negative.   Skin: Negative.   Neurological: Negative.   Psychiatric/Behavioral: Negative.     Blood pressure (!) 155/99, pulse (!) 59, temperature 97.6 F (36.4 C), temperature source Oral, resp. rate 20, SpO2 98 %. Physical Exam  Constitutional: She appears well-developed and well-nourished.  HENT:  Head: Normocephalic and atraumatic.  Mouth/Throat: No oropharyngeal exudate.  Eyes: Conjunctivae are normal. Pupils are equal, round, and reactive to light. No scleral icterus.  Neck: Normal range of motion. Neck supple.  Cardiovascular: Normal rate and regular rhythm.   Respiratory: Effort normal and breath sounds normal.  GI: Soft. She exhibits no distension. There is tenderness.  Musculoskeletal: Normal range of motion.  Skin:        Assessment/Plan Right groin abscess secondary to right groin hidradenitis  Admit for IV antibiotics IV fluids and incision and drainage today. Discuss plan of care and patient she agrees. Hopefully home in a.m. With dressing changes.  Tayia Stonesifer A., MD 06/23/2016, 6:36 AM

## 2016-06-23 NOTE — Op Note (Signed)
Operative Note  Yolanda PotterShaunita Richard  [id number]  [id number]  06/23/2016   Surgeon: Berna Buehelsea A Brennen Camper  Assistant: OR staff  Procedure performed: incision and drainage/debridement of perineal abscess x 3 (one right, two left)  Preop diagnosis: hidradenitis suppurativa Post-op diagnosis/intraop findings: Right groin abscess, left groin abscess, left gluteal abscess  Specimens: none Retained items: Iodoform packing strips x 3 EBL: minimal cc Complications: none  Description of procedure: After obtaining informed consent the patient was taken to the operating room and placed supine on operating room table wheregeneral anesthesia was initiated, preoperative antibiotics were administered, SCDs applied, and a formal timeout was performed. She was placed in lithotomy position with all pressure points appropriately padded. The perineum and bilateral groins were prepped and draped in the usual sterile fashion with betadine. The most prominent abscess was in the right groin. This was incised sharply and the abscess cavity was bluntly probed to evacuate all loculations. The wound was extended and the edges debrided with cautery to create a 1.5x0.5cm elliptical wound. The abscess cavity tracked about 2cm anterior and 2cm posterior within the superficial subcutaneous tissue. Then turned to the left side where she has two smaller abscesses. The more posterior was sharply incised and bluntly probed, it did not track. The more anterior was sharply incised and bluntly probed and found to track about 1cm anteriorly. All three wounds were then irrigated, inspected for hemostasis and then packed individually with 1/4'' iodoform packing strips. Dry dressings were applied.   The patient was then awakened, extubated and taken to PACU in stable condition.   All counts were correct at the completion of the case.

## 2016-06-23 NOTE — Anesthesia Postprocedure Evaluation (Addendum)
Anesthesia Post Note  Patient: Yolanda PotterShaunita Richard  Procedure(s) Performed: Procedure(s) (LRB): IRRIGATION AND DEBRIDEMENT BILATERAL GROIN ABCESSES (Bilateral)  Patient location during evaluation: PACU Anesthesia Type: General Level of consciousness: awake and alert Pain management: pain level controlled Vital Signs Assessment: post-procedure vital signs reviewed and stable Respiratory status: spontaneous breathing, nonlabored ventilation, respiratory function stable and patient connected to nasal cannula oxygen Cardiovascular status: blood pressure returned to baseline and stable Postop Assessment: no signs of nausea or vomiting Anesthetic complications: no       Last Vitals:  Vitals:   06/23/16 1845 06/23/16 1905  BP: (!) 147/97 (!) 182/94  Pulse:    Resp: 19 15  Temp:  36.8 C                  Shelton SilvasKevin D Hollis

## 2016-06-24 ENCOUNTER — Encounter (HOSPITAL_COMMUNITY): Payer: Self-pay | Admitting: Surgery

## 2016-06-24 MED ORDER — FLUCONAZOLE 100 MG PO TABS: ORAL_TABLET | ORAL | 0 refills | Status: DC

## 2016-06-24 MED ORDER — LIDOCAINE HCL 4 % EX SOLN
Freq: Once | CUTANEOUS | Status: AC
  Administered 2016-06-24: 09:00:00 via TOPICAL
  Filled 2016-06-24: qty 50

## 2016-06-24 MED ORDER — OXYCODONE HCL 5 MG PO TABS: 5.0000 mg | ORAL_TABLET | ORAL | 0 refills | Status: DC | PRN

## 2016-06-24 MED ORDER — PHENYLEPHRINE HCL 10 % OP SOLN
Freq: Once | OPHTHALMIC | Status: DC
  Filled 2016-06-24: qty 10

## 2016-06-24 MED ORDER — DOXYCYCLINE HYCLATE 100 MG PO TABS: 100.0000 mg | ORAL_TABLET | Freq: Two times a day (BID) | ORAL | 0 refills | Status: AC

## 2016-06-24 NOTE — Discharge Instructions (Signed)
[facility] _______[facility]  POST OP INSTRUCTIONS  Always review your discharge instruction sheet given to you by the facility where your surgery was performed. IF YOU HAVE DISABILITY OR FAMILY LEAVE FORMS, YOU MUST BRING THEM TO THE OFFICE FOR PROCESSING.   DO NOT GIVE THEM TO YOUR DOCTOR.  1. A  prescription for pain medication may be given to you upon discharge.  Take your pain medication as prescribed, if needed.  If narcotic pain medicine is not needed, then you may take acetaminophen (Tylenol) or ibuprofen (Advil) as needed. 2. Take your usually prescribed medications unless otherwise directed. 3. If you need a refill on your pain medication, please contact your pharmacy.  They will contact our office to request authorization. Prescriptions will not be filled after 5 pm or on week-ends. 4. You should follow a light diet the first 48 hours after arrival home, such as soup and crackers, etc.  Be sure to include lots of fluids daily.  Resume your normal diet 2-3 days after surgery.. 5. Most patients will experience some swelling and discomfort in the surgical site area. Ice packs, reclining and warm tub soaks will help.  Swelling and discomfort can take several days to resolve.  6. It is common to experience some constipation if taking pain medication after surgery.  Increasing fluid intake and taking a stool softener (such as Colace) will usually help or prevent this problem from occurring.  A mild laxative (Milk of Magnesia or Miralax) should be taken according to package directions if there are no bowel movements after 48 hours. 7. Keep wounds clean and dry. Wear a gauze pad to protect clothing, change as needed. 8. ACTIVITIES:  You may resume regular (light) daily activities beginning the next day--such as daily self-care, walking, climbing stairs--gradually increasing activities as tolerated.  You may have sexual intercourse when it is comfortable.   a. You may drive when you are  no longer taking prescription pain medication, you can comfortably wear a seatbelt, and you can safely maneuver your car and apply brakes. b. RETURN TO WORK: : _____1 week_______________ c.  9. You should see your doctor in the office for a follow-up appointment approximately 2-3 weeks after your surgery.  Make sure that you call for this appointment within a day or two after you arrive home to insure a convenient appointment time. 10. OTHER INSTRUCTIONS:  __________________________________________________________________________________________________________________________________________________________________________________________  WHEN TO CALL YOUR DOCTOR: 1. Fever over 101.0 2. Inability to urinate 3. Nausea and/or vomiting 4. Extreme swelling or bruising 5. Continued bleeding from rectum. 6. Increased pain, redness, or drainage from the incision 7. Constipation  The clinic staff is available to answer your questions during regular business hours.  Please dont hesitate to call and ask to speak to one of the nurses for clinical concerns.  If you have a medical emergency, go to the nearest emergency room or call 911.  A surgeon from [facility] is always on call at the hospital   [address], [address], [place], [place]  [address] ?  [address] [address], [place], [place]   [address] [phone] ? [phone] ? FAX [phone] Web site: [email or web address]

## 2016-06-24 NOTE — Discharge Summary (Signed)
Physician Discharge Summary  Patient ID: Yolanda Richard MRN: [id number] DOB/AGE: Nov 21, 1970 46 y.o.  Admit date: 06/22/2016 Discharge date: 06/24/2016  Admission Diagnoses:  Discharge Diagnoses:  Active Problems:   Hidradenitis   Discharged Condition: good  Hospital Course: She was admitted from the ER with perineal abscesses from hidradenitis. She was taken to the OR on the day of admission and underwent I&D of 3 small abscesses. On post op day one her packing was removed. Her pain was well controlled. Her vitals were normal. She was deemed stable for discharge.   Consults: None  Significant Diagnostic Studies: see epic chart  Treatments: surgery: I&D, see op note  Discharge Exam: Blood pressure (!) 136/94, pulse (!) 53, temperature 98.2 F (36.8 C), temperature source Oral, resp. rate 16, height 5' 3.5" (1.613 m), weight 73.5 kg (162 lb), SpO2 100 %. General appearance: alert and cooperative Resp: clear to auscultation bilaterally GI: soft, non-tender; bowel sounds normal; no masses,  no organomegaly Incision/Wound: I&D sites x 3 (one right, 2 left) packing removed with minimal purulence and residual induration present.   Disposition: 01-Home or Self Care  Discharge Instructions    Increase activity slowly    Complete by:  As directed      Allergies as of 06/24/2016      Reactions   Ace Inhibitors Swelling   angioedema   Flagyl [metronidazole] Nausea And Vomiting      Medication List    STOP taking these medications   amLODipine 2.5 MG tablet Commonly known as:  NORVASC     TAKE these medications   aspirin EC 81 MG tablet Take 81 mg by mouth daily.   clindamycin 1 % lotion Commonly known as:  CLEOCIN T Apply topically 2 (two) times daily.   doxycycline 100 MG tablet Commonly known as:  VIBRA-TABS Take 1 tablet (100 mg total) by mouth every 12 (twelve) hours.   hydrochlorothiazide 12.5 MG capsule Commonly known as:  MICROZIDE Take 1 capsule (12.5  mg total) by mouth daily.   oxyCODONE 5 MG immediate release tablet Commonly known as:  Oxy IR/ROXICODONE Take 1 tablet (5 mg total) by mouth every 4 (four) hours as needed for severe pain or breakthrough pain.   valACYclovir 1000 MG tablet Commonly known as:  VALTREX Take 1 tablet (1,000 mg total) by mouth as needed. What changed:  when to take this  reasons to take this      Follow-up Information    [facility] week(s).   Specialty:  General Surgery Contact information: [address] [place] [address] [phone]           Signed: Berna BueChelsea A Kaspar Albornoz 06/24/2016, 8:34 AM

## 2016-06-24 NOTE — Progress Notes (Signed)
Patient was given prescriptions and discharge instructions. Patient's questions were answered and she was taken to the main entrance via wheelchair. 

## 2016-06-26 ENCOUNTER — Encounter (HOSPITAL_COMMUNITY): Payer: 59

## 2016-06-26 ENCOUNTER — Ambulatory Visit (HOSPITAL_COMMUNITY): Payer: 59 | Attending: Cardiology

## 2016-06-26 DIAGNOSIS — I259 Chronic ischemic heart disease, unspecified: Secondary | ICD-10-CM | POA: Diagnosis present

## 2016-06-26 LAB — MYOCARDIAL PERFUSION IMAGING
CHL CUP MPHR: 176 {beats}/min
CHL CUP NUCLEAR SDS: 0
CHL CUP RESTING HR STRESS: 48 {beats}/min
CSEPED: 8 min
Estimated workload: 10.1 METS
Exercise duration (sec): 15 s
LHR: 0.29
LVDIAVOL: 91 mL (ref 46–106)
LVSYSVOL: 45 mL
NUC STRESS TID: 1
Peak HR: 164 {beats}/min
Percent HR: 93 %
SRS: 2
SSS: 2

## 2016-06-26 MED ORDER — TECHNETIUM TC 99M TETROFOSMIN IV KIT
32.2000 | PACK | Freq: Once | INTRAVENOUS | Status: AC | PRN
  Administered 2016-06-26: 32.2 via INTRAVENOUS
  Filled 2016-06-26: qty 33

## 2016-06-26 MED ORDER — TECHNETIUM TC 99M TETROFOSMIN IV KIT
10.2000 | PACK | Freq: Once | INTRAVENOUS | Status: AC | PRN
  Administered 2016-06-26: 10.2 via INTRAVENOUS
  Filled 2016-06-26: qty 11

## 2016-06-30 ENCOUNTER — Telehealth: Payer: Self-pay | Admitting: Interventional Cardiology

## 2016-06-30 NOTE — Telephone Encounter (Signed)
New message ° ° ° °Pt is returning call to Jennifer. °

## 2016-06-30 NOTE — Telephone Encounter (Signed)
Spoke with pt and made her aware of stress test results. Pt verbalized understandnig.

## 2016-07-03 NOTE — Addendum Note (Signed)
Addendum  created 07/03/16 1236 by Leyanna Bittman D, MD   Sign clinical note    

## 2016-12-18 ENCOUNTER — Ambulatory Visit
Admission: EM | Admit: 2016-12-18 | Discharge: 2016-12-18 | Disposition: A | Discharge status: Home / Self Care | Payer: 59 | Attending: Emergency Medicine | Admitting: Emergency Medicine

## 2016-12-18 DIAGNOSIS — N760 Acute vaginitis: Secondary | ICD-10-CM

## 2016-12-18 DIAGNOSIS — B9689 Other specified bacterial agents as the cause of diseases classified elsewhere: Secondary | ICD-10-CM

## 2016-12-18 LAB — CHLAMYDIA/NGC RT PCR (ARMC ONLY)
Chlamydia Tr: NOT DETECTED
N gonorrhoeae: NOT DETECTED

## 2016-12-18 LAB — WET PREP, GENITAL
SPERM: NONE SEEN
TRICH WET PREP: NONE SEEN
YEAST WET PREP: NONE SEEN

## 2016-12-18 MED ORDER — METRONIDAZOLE 0.75 % VA GEL: VAGINAL | 0 refills | Status: AC

## 2016-12-18 NOTE — ED Triage Notes (Signed)
Patient states she has reoccurring boils in the vaginal area. She states she noticed the boil draining and placed a gauze over it. Patient states she noticed that the gauze was not there about 4 days ago and has been experiencing a vaginal odor, vaginal discharge and minor cramping in her lower abdomen Patient believes the gauze may have gotten inside her vagina.

## 2016-12-18 NOTE — Discharge Instructions (Signed)
Take the medication as written. Give us a working phone number so that we can contact you if needed. Refrain from sexual contact until you know your results and your partner(s) are treated if necessary. Return to the ER if you get worse, have a fever >100.4, or for any concerns.   Here is a list of primary care providers who are taking new patients:  Dr. Elizabeth Sauereanna Jones, Dr. Schuyler AmorWilliam Plonk [address] [place] [address] [phone]  [facility] [place] [address]  [place] [address]  [phone]  [facility] [address]  [place], [place] [address] [phone]  [facility] [place] [address]  [phone] [place], [place] [address]  Here are clinics/ other resources who will see you if you do not have insurance. Some have certain criteria that you must meet. Call them and find out what they are:  [facility]: [address] [place] [place], [place] [address] Phone: [phone] Hours: First and Third Saturdays of each Month, 9 a.m. - 1 p.m.  [facility]: [address], [place], [place] [address] Phone: [phone] Hours: Tuesday, 4 p.m. - 8 p.m. Thursday, 1 p.m. - 8 p.m. Wednesday, 9 a.m. - [facility]  [facility] [address], [place], [place] [address] Phone: [phone] Pharmacy Phone Number: [phone] Dental Phone Number: [phone] [facility] Insurance Help: [phone]  Dental Hours: Monday - Thursday, 8 a.m. - 6 p.m.  [facility] [address] [place], [place] [address] Phone: [phone] Pharmacy Phone Number: [phone] [facility] Insurance Help: [phone]  [facility] [address] [place], [place] [address] Phone: [phone] Pharmacy Phone Number: [phone] [facility] Insurance Help: [phone]  [facility] [address] [place], [place] [address] Phone: [phone] [facility] Insurance Help: [phone]    [facility] [address] [place], [place] [address] Phone: [phone]  Go to www.goodrx.com to look up your medications. This will give you a list of where you can find your prescriptions at the most affordable prices. Or ask the pharmacist what the cash price is, or if they have any other discount programs available to help make your medication more affordable. This can be less expensive than what you would pay with insurance.

## 2016-12-18 NOTE — ED Provider Notes (Signed)
HPI  SUBJECTIVE:  Yolanda Richard is a 46 y.o. female who presents with odorous vaginal discharge and genital itching 2 days ago. states that she may have lost a gauze that she was using as a dressing for a vaginal boil 6 days ago.  Reports intermittent lower abdominal pelvic cramping that lasts minutes.  No aggravating or alleviating factors.  It resolves on its own.  No nausea, vomiting, fevers, back pain.  No genital rash.  No urinary complaints.  She states that the vaginal odor smells the same as when she had a retained condom.  She is in a long-term monogamous relationship with a female who is asymptomatic, STDs are not a concern today.  There are no aggravating or alleviating factors.  She has not tried anything for symptoms.  She has a past medical history of HSV, trichomonas, BV, yeast, prediabetes and hypertension.  No history of PID, gonorrhea, chlamydia, HIV, syphilis.  LMP: Status post hysterectomy.  ZOX:WRUEAVPMD:Polite, Windy Fastonald, MD    Past Medical History:  Diagnosis Date  . Anemia   . Anxiety   . Asthma   . Bipolar disorder (HCC)    no meds  . Depression    no meds  . Hidradenitis suppurativa   . High cholesterol   . Hydradenitis   . Hypertension   . Pre-diabetes   . Preterm labor   . Trichomonas     Past Surgical History:  Procedure Laterality Date  . ABDOMINAL HYSTERECTOMY    . EXCISION HIDRADENITIS AXILLA right axilla abscess Right 12/07/2013   [facility]formed [facility] Em[facility] LoronWakefield, Matthew[facility]  . INCISION AND DRAINAGE ABSCESS Left 02/23/2015   [facility]formed [facility] Avel Peaceosenbower, Todd[facility]  . INCISION AND DRAINAGE BILATERAL HIDRADENITIS AXILLA Bilateral 04/12/2015   [facility]formed [facility] Griselda Mineroth, Paul III[facility] [place]  . IRRIGATION AND DEBRIDEMENT BILATERAL GROIN ABCESSES Bilateral 06/23/2016   [facility]formed [facility] Berna Bueonnor, Chelsea A[facility]  . MOUTH SURGERY      Family History  Problem Relation Age of Onset  . Cancer Mother   . Diabetes Mother   . Hypertension Mother    . Cancer Maternal Aunt   . Stroke Paternal Uncle   . Diabetes Paternal Uncle   . Cancer Maternal Grandmother   . Cancer Paternal Grandmother   . Healthy Father   . Other Neg Hx     Social History   Tobacco Use  . Smoking status: Current Every Day Smoker    Packs/day: 0.50    Years: 16.00    Pack years: 8.00    Types: Cigarettes  . Smokeless tobacco: Never Used  Substance Use Topics  . Alcohol use: No    Frequency: Never  . Drug use: No    No current facility-administered medications for this encounter.   Current Outpatient Medications:  .  valACYclovir (VALTREX) 1000 MG tablet, Take 1 tablet (1,000 mg total) by mouth as needed. (Patient taking differently: Take 1,000 mg by mouth daily as needed (outbreak). ), Disp: 21 tablet, Rfl: 2 .  aspirin EC 81 MG tablet, Take 81 mg by mouth daily., Disp: , Rfl:  .  hydrochlorothiazide (MICROZIDE) 12.5 MG capsule, Take 1 capsule (12.5 mg total) by mouth daily., Disp: 30 capsule, Rfl: 0 .  metroNIDAZOLE (METROGEL) 0.75 % vaginal gel, 1 applicator full pv bid x 5 days, Disp: 70 g, Rfl: 0  Allergies  Allergen Reactions  . Ace Inhibitors Swelling    angioedema  . Flagyl [  Metronidazole] Nausea And Vomiting     ROS  As noted in HPI.   Physical Exam  BP 138/83 (BP Location: Left Arm)   Pulse 63   Temp 98.8 F (37.1 C) (Oral)   Ht 5\' 3"  (1.6 m)   Wt 160 lb (72.6 kg)   SpO2 99%   BMI 28.34 kg/m   Constitutional: Well developed, well nourished, no acute distress Eyes:  EOMI, conjunctiva normal bilaterally HENT: Normocephalic, atraumatic,mucus membranes moist Respiratory: Normal inspiratory effort Cardiovascular: Normal rate GI: nondistended.  No suprapubic tenderness GU: Normal external genitalia.  Normal vaginal mucosa.  No foreign body noted on speculum or bimanual exam.  Thin odorous vaginal discharge, surgical cuff intact.  No adnexal tenderness.  Chaperone present during exam. skin: No rash, skin  intact Musculoskeletal: no deformities Neurologic: Alert & oriented x 3, no focal neuro deficits Psychiatric: Speech and behavior appropriate   ED Course   Medications - No data to display  Orders Placed This Encounter  Procedures  . Chlamydia/NGC rt PCR    Standing Status:   Standing    Number of Occurrences:   1    Order Specific Question:   Patient immune status    Answer:   Normal  . Wet prep, genital    Standing Status:   Standing    Number of Occurrences:   1    Order Specific Question:   Patient immune status    Answer:   Normal    Results for orders placed or performed during the hospital encounter of 12/18/16 (from the past 24 hour(s))  Wet prep, genital     Status: Abnormal   Collection Time: 12/18/16  6:23 PM  Result Value Ref Range   Yeast Wet Prep HPF POC NONE SEEN NONE SEEN   Trich, Wet Prep NONE SEEN NONE SEEN   Clue Cells Wet Prep HPF POC PRESENT (A) NONE SEEN   WBC, Wet Prep HPF POC FEW (A) NONE SEEN   Sperm NONE SEEN    No results found.  ED Clinical Impression  Bacterial vaginosis   ED Assessment/Plan  Ordering wet prep, gonorrhea, Chlamydia.  Deferring HIV, syphilis testing per patient request.  Wet prep positive for BV.  No yeast, Trichomonas.  GC chlamydia pending. Plan to send home with metronidazole vaginal gel 0.75% 1 applicator 5 g vaginally nightly for 5 days, follow-up with PCP of choice or with Dr. Nehemiah SettlePolite as needed,  will provide local primary care referral list.  Discussed labs, imaging, MDM, plan and followup with patient. Discussed sn/sx that should prompt return to the ED. patient agrees with plan.   Meds ordered this encounter  Medications  . metroNIDAZOLE (METROGEL) 0.75 % vaginal gel    Sig: 1 applicator full pv bid x 5 days    Dispense:  70 g    Refill:  0    *This clinic note was created using Scientist, clinical (histocompatibility and immunogenetics)Dragon dictation software. Therefore, there may be occasional mistakes despite careful proofreading.   ?   Domenick GongMortenson,  Dorena Dorfman, MD 12/18/16 [phone]

## 2016-12-27 ENCOUNTER — Telehealth: Payer: Self-pay | Admitting: Emergency Medicine

## 2016-12-27 NOTE — Telephone Encounter (Signed)
Patient called regarding recent test results.  Demographics verified, negative results communicated, patient voiced understanding.  NMW

## 2019-02-03 IMAGING — NM NM MISC PROCEDURE
6 series · 36 of 36 positions shown · non-contrast
Comparison: none

[Series 1: rest · 6.51mm/px · 6 of 64 frames shown]
[frame 6/64]
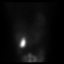
[frame 16/64]
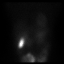
[frame 27/64]
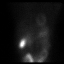
[frame 38/64]
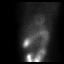
[frame 48/64]
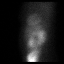
[frame 59/64]
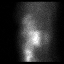

[Series 1: wbr_r-proj_st rest · 6.51mm/px · 6 of 64 frames shown]
[frame 6/64]
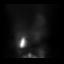
[frame 16/64]
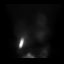
[frame 27/64]
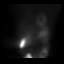
[frame 38/64]
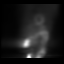
[frame 48/64]
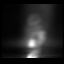
[frame 59/64]
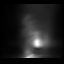

[Series 2: wbr_s-proj_st stress · 6.51mm/px · 6 of 512 frames shown (1 of 2)]
[frame 43/512]
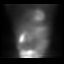
[frame 128/512]
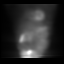
[frame 214/512]
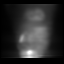
[frame 299/512]
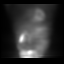
[frame 384/512]
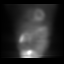
[frame 470/512]
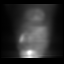

[Series 2: stress · 6.51mm/px · 6 of 512 frames shown (1 of 2)]
[frame 43/512]
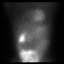
[frame 128/512]
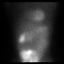
[frame 214/512]
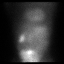
[frame 299/512]
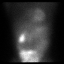
[frame 384/512]
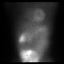
[frame 470/512]
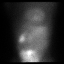

[Series 2: stress · 6.51mm/px · 6 of 64 frames shown (2 of 2)]
[frame 6/64]
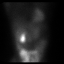
[frame 16/64]
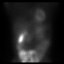
[frame 27/64]
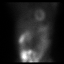
[frame 38/64]
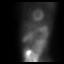
[frame 48/64]
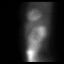
[frame 59/64]
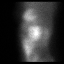

[Series 2: wbr_s-proj_st stress · 6.51mm/px · 6 of 64 frames shown (2 of 2)]
[frame 6/64]
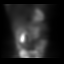
[frame 16/64]
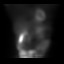
[frame 27/64]
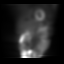
[frame 38/64]
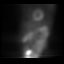
[frame 48/64]
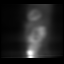
[frame 59/64]
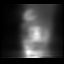

[36 of 36 positions shown; findings below may reference images not displayed]

Canned report from images found in remote index.

Refer to host system for actual result text.
